# Patient Record
Sex: Female | Born: 1985 | Race: Black or African American | Hispanic: No | Marital: Single | Smoking: Never smoker
Health system: Southern US, Community
[De-identification: ages and names within clinical notes are randomized; demographics above are authoritative.]

## PROBLEM LIST (undated history)

## (undated) DIAGNOSIS — G43109 Migraine with aura, not intractable, without status migrainosus: Secondary | ICD-10-CM

## (undated) DIAGNOSIS — T8859XA Other complications of anesthesia, initial encounter: Secondary | ICD-10-CM

## (undated) DIAGNOSIS — T4145XA Adverse effect of unspecified anesthetic, initial encounter: Secondary | ICD-10-CM

## (undated) HISTORY — PX: EYE SURGERY: SHX253

## (undated) HISTORY — DX: Migraine with aura, not intractable, without status migrainosus: G43.109

## (undated) HISTORY — PX: HERNIA REPAIR: SHX51

---
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
## 2007-06-16 ENCOUNTER — Emergency Department (HOSPITAL_COMMUNITY): Admission: EM | Admit: 2007-06-16 | Discharge: 2007-06-16 | Payer: Self-pay | Admitting: Emergency Medicine

## 2008-07-05 ENCOUNTER — Emergency Department (HOSPITAL_COMMUNITY): Admission: EM | Admit: 2008-07-05 | Discharge: 2008-07-05 | Payer: Self-pay | Admitting: Emergency Medicine

## 2008-07-31 ENCOUNTER — Ambulatory Visit: Payer: Self-pay | Admitting: Family

## 2008-07-31 ENCOUNTER — Inpatient Hospital Stay (HOSPITAL_COMMUNITY): Admission: AD | Admit: 2008-07-31 | Discharge: 2008-08-01 | Payer: Self-pay | Admitting: Obstetrics and Gynecology

## 2008-09-03 ENCOUNTER — Inpatient Hospital Stay (HOSPITAL_COMMUNITY): Admission: AD | Admit: 2008-09-03 | Discharge: 2008-09-03 | Payer: Self-pay | Admitting: Obstetrics

## 2008-09-17 ENCOUNTER — Ambulatory Visit (HOSPITAL_COMMUNITY): Admission: RE | Admit: 2008-09-17 | Discharge: 2008-09-17 | Payer: Self-pay | Admitting: Obstetrics

## 2008-11-15 ENCOUNTER — Inpatient Hospital Stay (HOSPITAL_COMMUNITY): Admission: RE | Admit: 2008-11-15 | Discharge: 2008-11-17 | Payer: Self-pay | Admitting: Obstetrics

## 2010-03-15 IMAGING — US US OB LIMITED
1 series · 14 of 18 positions shown · non-contrast
Comparison: none

CLINICAL DATA: Pregnant patient with suprapubic and left-sided
pelvic pain.  G1 P1.  LMP 05/11/2008.

[Series 1: us ob limited · 0.26mm/px · 14 of 18 slices shown]
[im 1/18]
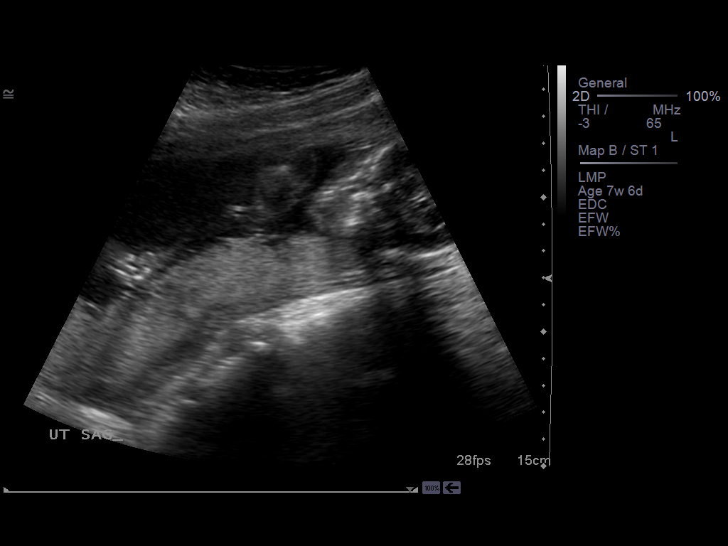
[im 2/18]
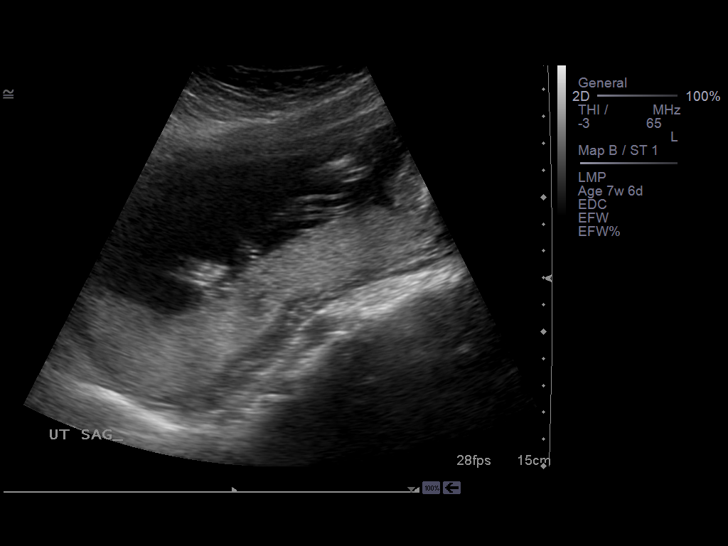
[im 4/18]
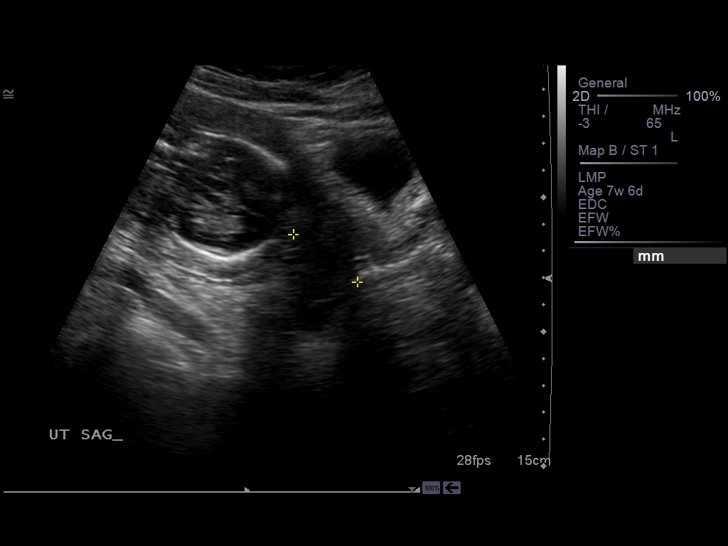
[im 5/18]
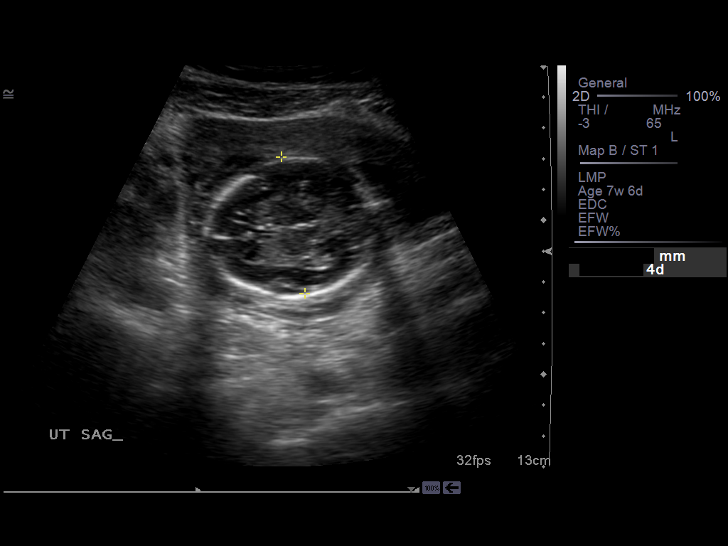
[im 6/18]
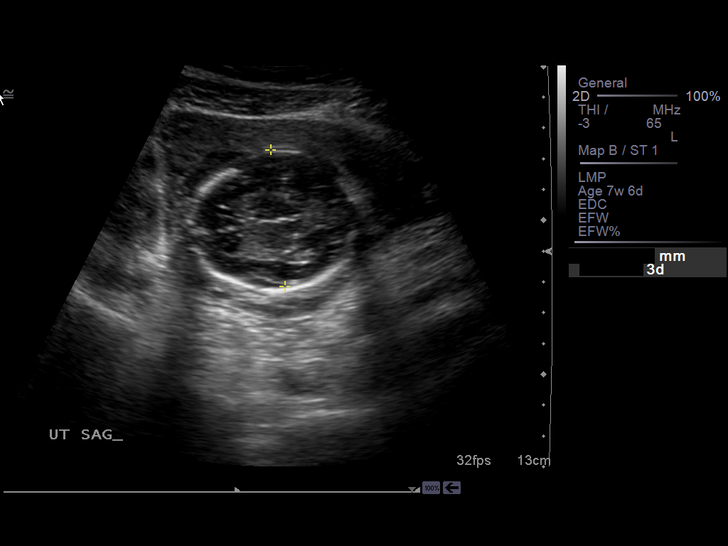
[im 8/18]
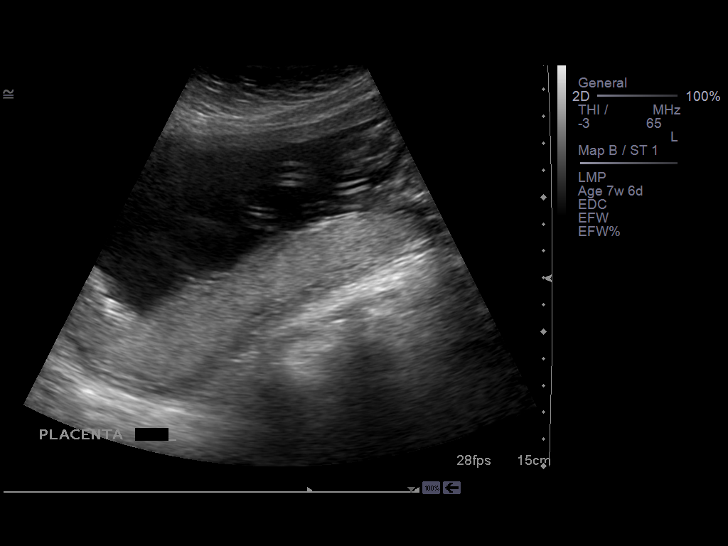
[im 9/18]
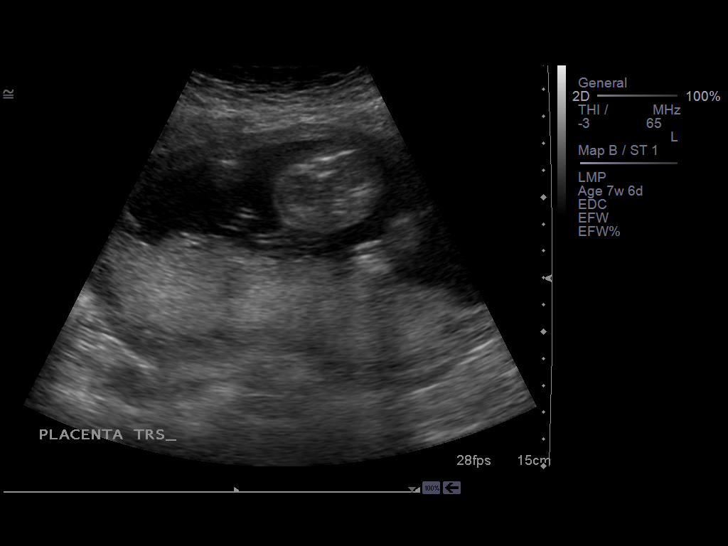
[im 10/18]
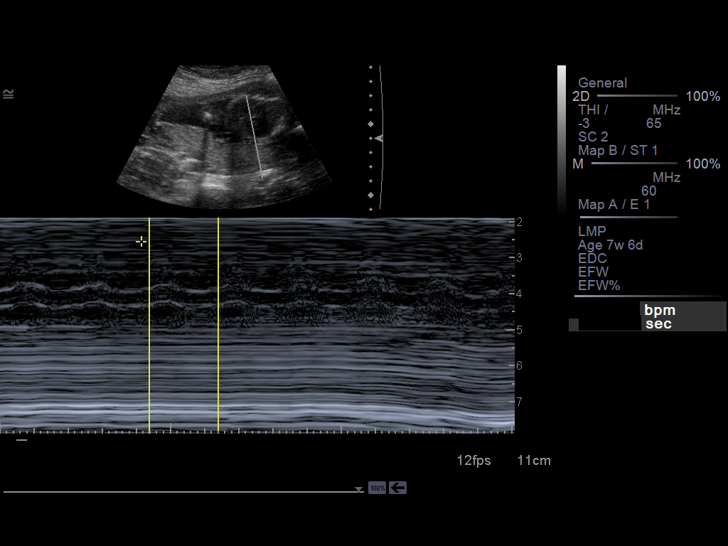
[im 11/18]
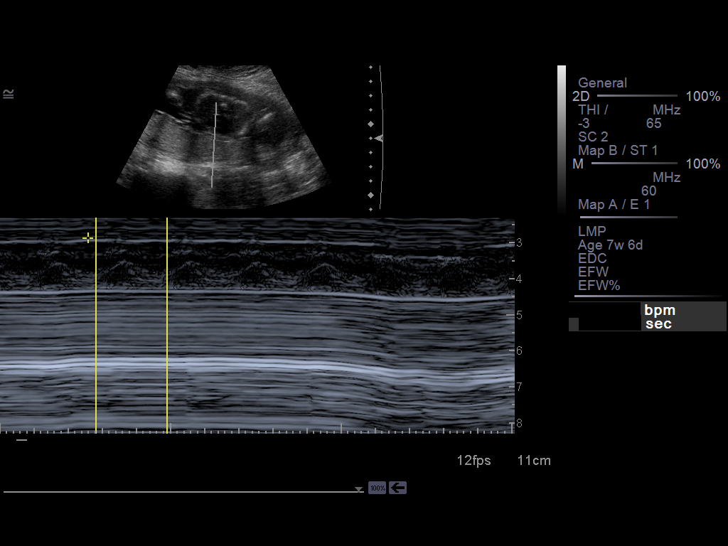
[im 13/18]
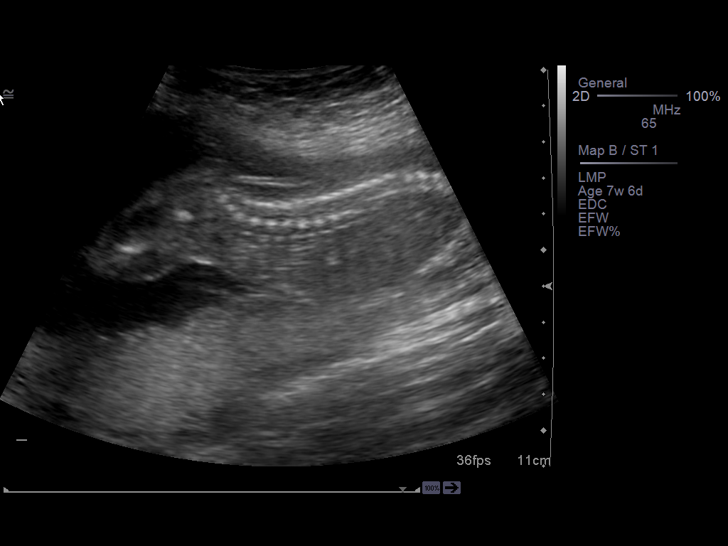
[im 14/18]
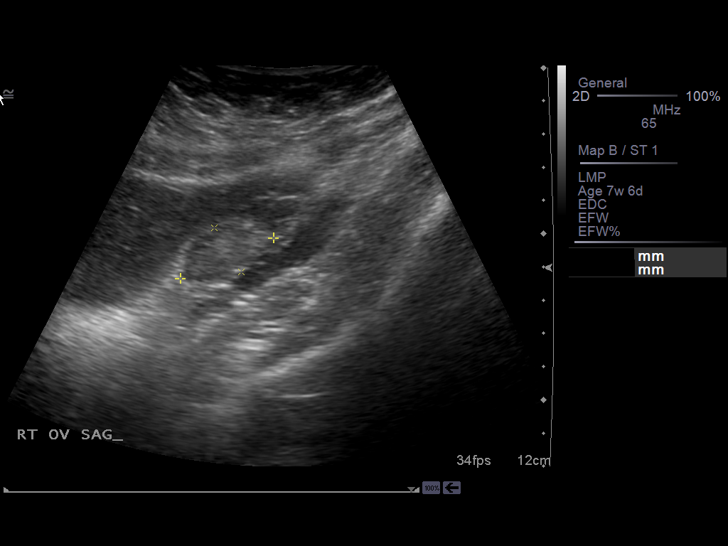
[im 15/18]
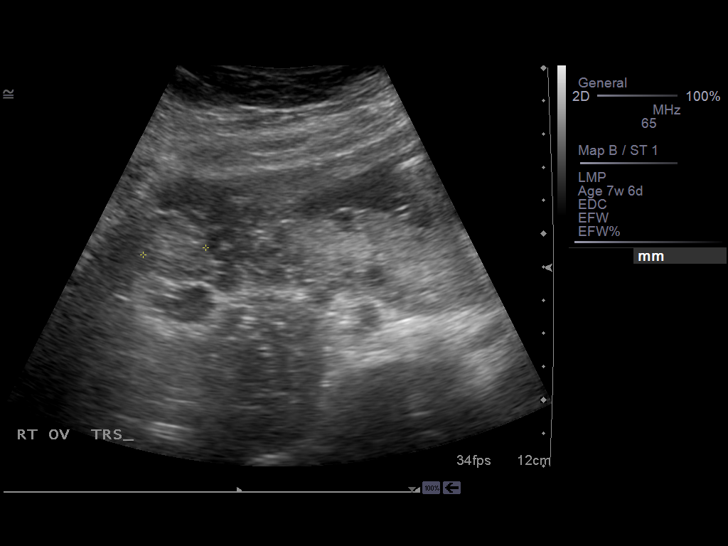
[im 17/18]
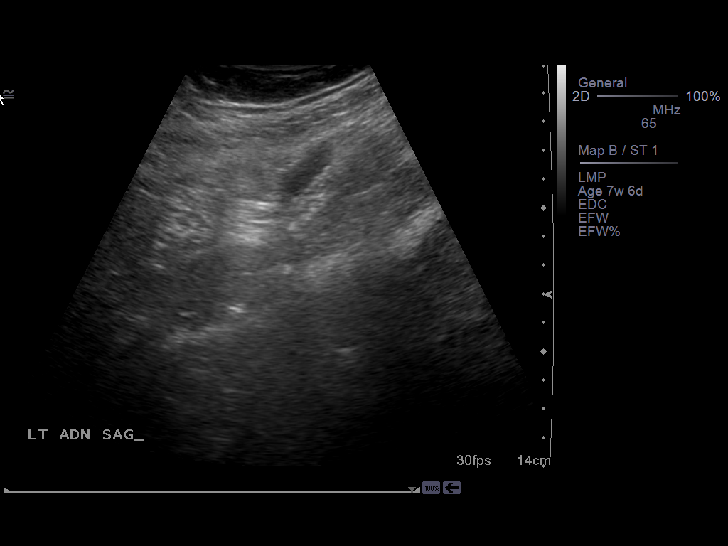
[im 18/18]
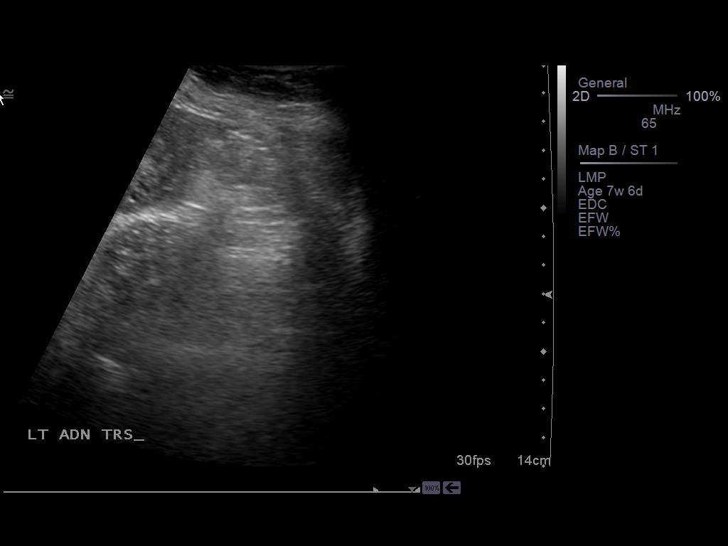

[14 of 18 positions shown; findings below may reference images not displayed]

LIMITED OBSTETRIC ULTRASOUND 07/05/2008:

Number of Fetuses: 1
Heart Rate: 511bpm
Movement: Visualized.
Breathing: Not visualized.
Presentation: Cephalic.
Placental Location: Posterior and fundal.
Grade: 0/1
Previa: No.
Amniotic Fluid (Subjective): Normal.

BPD:  4.5 cm (19 weeks 3 days).

MATERNAL FINDINGS:
Cervix closed.  Normal-appearing ovaries bilaterally.  No adnexal
masses or free fluid.
IMPRESSION: 1.  Single live intrauterine fetus with estimated gestational age
19 weeks 3 days by BPD.  This is well beyond the stated gestational
age by LMP.
2.  Normal-appearing ovaries.  No adnexal masses or free fluid.

## 2010-04-15 LAB — CBC
HCT: 33.8 % — ABNORMAL LOW (ref 36.0–46.0)
HCT: 37.8 % (ref 36.0–46.0)
Hemoglobin: 12.9 g/dL (ref 12.0–15.0)
MCHC: 34.1 g/dL (ref 30.0–36.0)
MCV: 92.7 fL (ref 78.0–100.0)
Platelets: 176 10*3/uL (ref 150–400)
RBC: 3.64 MIL/uL — ABNORMAL LOW (ref 3.87–5.11)
RDW: 13.2 % (ref 11.5–15.5)
RDW: 13.3 % (ref 11.5–15.5)
WBC: 12.1 10*3/uL — ABNORMAL HIGH (ref 4.0–10.5)

## 2010-04-15 LAB — RPR: RPR Ser Ql: NONREACTIVE

## 2010-04-20 LAB — URINE MICROSCOPIC-ADD ON

## 2010-04-20 LAB — WET PREP, GENITAL
Trich, Wet Prep: NONE SEEN
Yeast Wet Prep HPF POC: NONE SEEN

## 2010-04-20 LAB — URINALYSIS, ROUTINE W REFLEX MICROSCOPIC
Specific Gravity, Urine: 1.016 (ref 1.005–1.030)
Urobilinogen, UA: 1 mg/dL (ref 0.0–1.0)
pH: 6.5 (ref 5.0–8.0)

## 2010-05-29 NOTE — Op Note (Signed)
[place]  Patient:    Virginia Page, Virginia Page Visit Number: [id number] MRN: [id number]          Service Type: DSU Location: [facility] Attending Physician:  Shara Blazing Dictated by:   Pasty Spillers. Maple Hudson, M.D. Proc. Date: 07/22/00 Admit Date:  07/22/2000 Discharge Date: 07/22/2000                             Operative Report  PREOPERATIVE DIAGNOSIS:  Large-angle "V" pattern exotropia.  POSTOPERATIVE DIAGNOSIS:  Large-angle "V" pattern exotropia.  PROCEDURES: 1. Left lateral rectus muscle recession, 8.5 mm. 2. Left medial rectus muscle resection, 7.0 mm. 3. Right lateral rectus muscle recession, 9.0 mm. 4. Inferior oblique muscle recession OU.  SURGEON:  Pasty Spillers. Maple Hudson, M.D.  ANESTHESIA:  General (laryngeal mask).  COMPLICATIONS:  None.  DESCRIPTION OF PROCEDURE:  After routine preoperative evaluation, including informed consent, the patient was taken to the operating room, where she was identified by me.  General anesthesia was induced without difficulty after placement of appropriate monitors.  The patient was prepped and draped in the standard sterile fashion.  A lid speculum was placed in the left eye.  Through an inferotemporal fornix incision, conjunctiva and Tenons fascia, the left lateral rectus muscle was engaged on a series of muscle hooks.  A traction suture of 4-0 silk was passed under the left lateral rectus muscle, and this was used to draw the eye up and in.  Using two muscle hooks for exposure through the conjunctival incision, the left inferior oblique muscle was engaged on a series of muscle hooks and cleared of its fascial attachments all the way to its insertion, which was secured with a fine curved hemostat. The muscle was disinserted and its cut end brought forward and secured with a double-armed 6-0 Vicryl suture, with a double locking bite at each border of the muscle.  The right inferior rectus muscle was engaged on a  series of muscle hooks.  A mark was made on the sclera 3 mm posterior and 3 mm temporal to the temporal border of the anterior rectus insertion.  This was used as the exit point for the two pole sutures of the inferior oblique muscle, which were passed in crossed-swords fashion and tied securely.  The left lateral rectus muscle was then again engaged on a series of muscle hooks and carefully cleared of its surrounding fascial attachments.  The tendon was secured with a double-armed 6-0 Vicryl suture with a double locking bite at each border of the tendon.  The muscle was disinserted from the globe using Westcott scissors.  It was reattached to sclera at a measured distance of 8.5 mm posterior to the unoperated insertion, using direct scleral passes in a crossed-swords fashion.  The suture ends were tied securely after the position of the muscle had been checked and found to be accurate.  The conjunctiva was closed with two interrupted 6-0 Vicryl sutures.  Through an inferonasal fornix incision through conjunctiva and Tenons fascia, the left medial rectus muscle was engaged on a series of muscle hooks and carefully cleared of its surrounding fascial attachments to at least 15 mm posterior to its insertion. The muscle was spread between two self-retaining hooks.  A 2 mm bite was taken of the center of the muscle belly at a measured distance of 7.0 mm posterior to the insertion.  Each pole suture was passed from the center of the muscle belly  to the periphery, 7.0 mm posterior to and parallel to the insertion, and a double locking bite was placed at each border of the muscle.  A resection clamp was placed on the muscle just anterior to these sutures.  The muscle was disinserted.  Each pole suture was then passed posteriorly to anteriorly through the periphery of the muscle stump, then anteriorly to posteriorly near the center of the stump, and then posteriorly to anteriorly through the  center of the muscle belly, just posterior to the previously-placed knots.  The muscle was drawn up to the level of the original insertion, and the suture ends were tied securely after all slack had been carefully removed.  The resection clamp was removed and the portion of the muscle anterior to these sutures was carefully excised.  Conjunctiva was closed with two interrupted 6-0 Vicryl sutures.  The lid speculum was transferred to the right eye, where the right inferior oblique was recessed just as described for the left, and the right lateral rectus muscle was recessed as described for the left, except the right was recessed 9.0 mm instead of 8.5.  No other muscle was operated on the right eye.  Conjunctiva was closed with two interrupted 6-0 Vicryl sutures. Tobradex ointment was placed in each eye.  The patient was awakened without difficulty and taken to the recovery room in stable condition, having suffered no intraoperative or immediate postoperative complications. Dictated by:   Pasty Spillers. Maple Hudson, M.D. Attending Physician:  Shara Blazing DD:  10/06/00 TD:  10/06/00 Job: [id number]

## 2010-06-29 ENCOUNTER — Emergency Department: Payer: Self-pay | Admitting: Emergency Medicine

## 2010-10-08 LAB — POCT PREGNANCY, URINE
Operator id: 277751
Preg Test, Ur: NEGATIVE

## 2011-06-24 ENCOUNTER — Ambulatory Visit: Payer: Self-pay | Admitting: Family Medicine

## 2011-07-27 ENCOUNTER — Ambulatory Visit: Payer: Self-pay | Admitting: Emergency Medicine

## 2011-08-30 ENCOUNTER — Encounter: Payer: Self-pay | Admitting: Emergency Medicine

## 2011-08-30 ENCOUNTER — Ambulatory Visit (INDEPENDENT_AMBULATORY_CARE_PROVIDER_SITE_OTHER): Payer: Medicaid Other | Admitting: Emergency Medicine

## 2011-08-30 VITALS — BP 116/79 | HR 76 | Ht 62.75 in | Wt 190.0 lb

## 2011-08-30 DIAGNOSIS — Z0000 Encounter for general adult medical examination without abnormal findings: Secondary | ICD-10-CM

## 2011-08-30 DIAGNOSIS — G43109 Migraine with aura, not intractable, without status migrainosus: Secondary | ICD-10-CM | POA: Insufficient documentation

## 2011-08-30 DIAGNOSIS — Z309 Encounter for contraceptive management, unspecified: Secondary | ICD-10-CM | POA: Insufficient documentation

## 2011-08-30 DIAGNOSIS — E669 Obesity, unspecified: Secondary | ICD-10-CM

## 2011-08-30 MED ORDER — LEVONORGEST-ETH ESTRAD 91-DAY 0.15-0.03 &0.01 MG PO TABS: 1.0000 | ORAL_TABLET | Freq: Every day | ORAL | Status: DC

## 2011-08-30 MED ORDER — PROPRANOLOL HCL ER 60 MG PO CP24: 60.0000 mg | ORAL_CAPSULE | Freq: Every day | ORAL | Status: DC

## 2011-08-30 NOTE — Assessment & Plan Note (Signed)
No red flags or neurologic findings.  Will start prophylaxis with propranolol ER 60mg  daily.  Side effects discussed.  If not tolerated will try topiramate.

## 2011-08-30 NOTE — Assessment & Plan Note (Signed)
Will need to check screening labs at next visit.

## 2011-08-30 NOTE — Patient Instructions (Addendum)
It was nice to see you!  I have sent a refill of your Seasonique to the pharmacy.  I also sent a prescription for Propranolol to the pharmacy.  Take 1 capsule once a day to prevent migraines.  The most common side effects are feeling run down, low heart rate and dizzy with standing.  If you have any of these please let me know and we can try another medicine.  Follow up in 1 year for annual exam or sooner as needed.

## 2011-08-30 NOTE — Assessment & Plan Note (Signed)
Working on diet and exercise

## 2011-08-30 NOTE — Assessment & Plan Note (Signed)
Doing well with seasonique.  Pap up to date.  Will refill seasonique.

## 2011-08-30 NOTE — Progress Notes (Signed)
  Subjective:    Patient ID: Virginia Page, female    DOB: 1985-09-27, 26 y.o.   MRN: [id number]  HPI Virginia Page is here for a new patient appointment.  I have reviewed and updated the following as appropriate: allergies, current medications, past family history, past medical history, past social history, past surgical history and problem list.  Eats a varied diet with fruits and vegetables.  Exercises 3-4 times a week.  No acute concerns.  Has been on Seasonique for birth control the last 2-3 years, no problems, needs a refill.  Migraines: Long history.  Typically one sided and throbbing.  +photophobia, nausea, aura.  Uses OTC meds, but they often do not work.  Occur 1-2 times per week.  OCPs have not affected migraines.    Review of Systems See HPI, negative for fevers, chest pain, shortness of breath, abdominal pain, constipation, numbness/tingling, dizziness.    Objective:   Physical Exam BP 116/79  Pulse 76  Ht 5' 2.75" (1.594 m)  Wt 190 lb (86.183 kg)  BMI 33.93 kg/m2 Gen: alert, cooperative, NAD HEENT: AT/[place], PERRL, EOMI, sclera white, MMM, no pharyngeal erythema or exudate Neck: no LAD, supple, thyroid normal CV: RRR, no murmurs Pulm: CTAB Abd: +BS, soft, NTND Ext: no edema, 2+ DP pulses Neuro: PERRL, 5/5 strength in all extremities, gait normal     Assessment & Plan:

## 2012-05-18 LAB — OB RESULTS CONSOLE RPR: RPR: NONREACTIVE

## 2012-05-18 LAB — OB RESULTS CONSOLE RUBELLA ANTIBODY, IGM: Rubella: IMMUNE

## 2012-08-10 ENCOUNTER — Other Ambulatory Visit: Payer: Self-pay | Admitting: Obstetrics and Gynecology

## 2012-08-14 LAB — OB RESULTS CONSOLE GC/CHLAMYDIA: Chlamydia: NEGATIVE

## 2012-08-17 ENCOUNTER — Institutional Professional Consult (permissible substitution): Payer: Self-pay | Admitting: Pediatrics

## 2012-08-29 ENCOUNTER — Telehealth: Payer: Self-pay | Admitting: *Deleted

## 2012-08-29 NOTE — Telephone Encounter (Signed)
npi given [facility] for c section Wyatt Haste, RN-BSN

## 2012-09-01 ENCOUNTER — Encounter (HOSPITAL_COMMUNITY)
Admission: RE | Admit: 2012-09-01 | Discharge: 2012-09-01 | Disposition: A | Discharge status: Home / Self Care | Payer: Medicaid Other | Source: Ambulatory Visit | Attending: Obstetrics and Gynecology | Admitting: Obstetrics and Gynecology

## 2012-09-01 ENCOUNTER — Encounter (HOSPITAL_COMMUNITY): Payer: Self-pay

## 2012-09-01 HISTORY — DX: Adverse effect of unspecified anesthetic, initial encounter: T41.45XA

## 2012-09-01 HISTORY — DX: Other complications of anesthesia, initial encounter: T88.59XA

## 2012-09-01 LAB — CBC
Hemoglobin: 13.9 g/dL (ref 12.0–15.0)
MCHC: 35.2 g/dL (ref 30.0–36.0)
WBC: 9.1 10*3/uL (ref 4.0–10.5)

## 2012-09-01 LAB — ABO/RH: ABO/RH(D): B POS

## 2012-09-01 LAB — RPR: RPR Ser Ql: NONREACTIVE

## 2012-09-01 LAB — TYPE AND SCREEN: ABO/RH(D): B POS

## 2012-09-01 NOTE — Pre-Procedure Instructions (Signed)
No prenatal labs available at pre op visit.

## 2012-09-01 NOTE — Patient Instructions (Addendum)
20 Virginia Page  09/01/2012   Your procedure is scheduled on:  09/04/12  Enter through the Main Entrance of [facility] at 7:30  Pick up the phone at the desk and dial [phone].   Call this number if you have problems the morning of surgery: [phone]   Remember:   Do not eat food:After Midnight.  Do not drink clear liquids: After Midnight.  Take these medicines the morning of surgery with A SIP OF WATER: NA   Do not wear jewelry, make-up or nail polish.  Do not wear lotions, powders, or perfumes. You may wear deodorant.  Do not shave 48 hours prior to surgery.  Do not bring valuables to the hospital.  [facility] is not responsible                  for any belongings or valuables brought to the hospital.  Contacts, dentures or bridgework may not be worn into surgery.  Leave suitcase in the car. After surgery it may be brought to your room.  For patients admitted to the hospital, checkout time is 11:00 AM the day of                discharge.   Patients discharged the day of surgery will not be allowed to drive                   home.  Name and phone number of your driver: NA  Special Instructions: Shower using CHG 2 nights before surgery and the night before surgery.  If you shower the day of surgery use CHG.  Use special wash - you have one bottle of CHG for all showers.  You should use approximately 1/3 of the bottle for each shower.   Please read over the following fact sheets that you were given: Surgical Site Infection Prevention

## 2012-09-03 NOTE — H&P (Signed)
  Admission History and Physical Exam for an Obstetrics Patient  [name] is a 27 y.o. female, G3 P2-0-0-2, at [redacted]w[redacted]d gestation, who presents for a repeat LTCS and BTL. She has been followed at the [facility] and Gynecology division of [facility].  Her pregnancy has been complicated by two prior CS. Late entry into prenatal care. See history below.    Past Medical History  Diagnosis Date  . Migraine with aura   . Complication of anesthesia     failed spinal anes last delivery    No prescriptions prior to admission    Past Surgical History  Procedure Laterality Date  . Cesarean section    . Eye surgery    . Hernia repair      Allergies  Allergen Reactions  . Demerol [Meperidine]     Family History: family history includes Diabetes in her father, maternal aunt, and maternal uncle; Heart disease in her father; Hyperlipidemia in her father; Hypertension in her father; Kidney disease in her father.  Social History:  reports that she has never smoked. She has never used smokeless tobacco. She reports that she does not drink alcohol or use illicit drugs.  Review of systems: Normal pregnancy complaints.  Admission Physical Exam:    There is no weight on file to calculate BMI.  There were no vitals taken for this visit.  HEENT:                 Within normal limits Chest:                   Clear Heart:                    Regular rate and rhythm Abdomen:             Gravid and nontender Extremities:          Grossly normal Neurologic exam: Grossly normal Pelvic exam:         Cervix: Closed  Prenatal labs: ABO, Rh:             --/--/B POS (08/22 1410) HBsAg:                   Neg HIV:                         NR GBS:                      Negative Antibody:              NEG (08/22 1407) Rubella:                 Immune RPR:                    NON REACTIVE (08/22 1406)      Prenatal Transfer Tool  Maternal Diabetes: No Genetic  Screening: Too late upon entry into prenatal care Maternal Ultrasounds/Referrals: No Fetal Ultrasounds or other Referrals:  No Maternal Substance Abuse:  None Significant Maternal Medications:  None Significant Maternal Lab Results:  None Other Comments:    Assessment:  [redacted]w[redacted]d gestation  Prior CS's  Desires Repeat CS and BTL  Plan:  Repeat LTCS and BTL   [name] 09/03/2012, 10:43 PM

## 2012-09-04 ENCOUNTER — Encounter (HOSPITAL_COMMUNITY): Payer: Self-pay | Admitting: Anesthesiology

## 2012-09-04 ENCOUNTER — Encounter (HOSPITAL_COMMUNITY)
Admission: AD | Disposition: A | Discharge status: Home / Self Care | Payer: Self-pay | Source: Ambulatory Visit | Attending: Obstetrics and Gynecology

## 2012-09-04 ENCOUNTER — Inpatient Hospital Stay (HOSPITAL_COMMUNITY): Payer: Medicaid Other | Admitting: Anesthesiology

## 2012-09-04 ENCOUNTER — Inpatient Hospital Stay (HOSPITAL_COMMUNITY)
Admission: AD | Admit: 2012-09-04 | Discharge: 2012-09-06 | DRG: 766 | Disposition: A | Discharge status: Home / Self Care | Payer: Medicaid Other | Source: Ambulatory Visit | Attending: Obstetrics and Gynecology | Admitting: Obstetrics and Gynecology

## 2012-09-04 ENCOUNTER — Encounter (HOSPITAL_COMMUNITY): Payer: Self-pay | Admitting: *Deleted

## 2012-09-04 DIAGNOSIS — O34219 Maternal care for unspecified type scar from previous cesarean delivery: Principal | ICD-10-CM | POA: Diagnosis present

## 2012-09-04 DIAGNOSIS — Z302 Encounter for sterilization: Secondary | ICD-10-CM

## 2012-09-04 SURGERY — Surgical Case
Anesthesia: Spinal | Site: Abdomen | Wound class: Clean Contaminated

## 2012-09-04 MED ORDER — CEFAZOLIN SODIUM-DEXTROSE 2-3 GM-% IV SOLR
2.0000 g | INTRAVENOUS | Status: AC
  Administered 2012-09-04: 2 g via INTRAVENOUS

## 2012-09-04 MED ORDER — ONDANSETRON HCL 4 MG/2ML IJ SOLN: 4.0000 mg | Freq: Three times a day (TID) | INTRAMUSCULAR | Status: DC | PRN

## 2012-09-04 MED ORDER — LACTATED RINGERS IV SOLN
INTRAVENOUS | Status: DC
  Administered 2012-09-04 (×3): via INTRAVENOUS

## 2012-09-04 MED ORDER — SCOPOLAMINE 1 MG/3DAYS TD PT72
MEDICATED_PATCH | TRANSDERMAL | Status: AC
  Administered 2012-09-04: 1.5 mg via TRANSDERMAL
  Filled 2012-09-04: qty 1

## 2012-09-04 MED ORDER — OXYTOCIN 10 UNIT/ML IJ SOLN
INTRAMUSCULAR | Status: AC
  Filled 2012-09-04: qty 4

## 2012-09-04 MED ORDER — NALBUPHINE SYRINGE 5 MG/0.5 ML
INJECTION | INTRAMUSCULAR | Status: AC
  Administered 2012-09-04: 10 mg via SUBCUTANEOUS
  Filled 2012-09-04: qty 1

## 2012-09-04 MED ORDER — ZOLPIDEM TARTRATE 5 MG PO TABS: 5.0000 mg | ORAL_TABLET | Freq: Every evening | ORAL | Status: DC | PRN

## 2012-09-04 MED ORDER — PRENATAL MULTIVITAMIN CH: 1.0000 | ORAL_TABLET | Freq: Every day | ORAL | Status: DC

## 2012-09-04 MED ORDER — OXYTOCIN 10 UNIT/ML IJ SOLN
40.0000 [IU] | INTRAVENOUS | Status: DC | PRN
  Administered 2012-09-04: 40 [IU] via INTRAVENOUS

## 2012-09-04 MED ORDER — ONDANSETRON HCL 4 MG/2ML IJ SOLN
INTRAMUSCULAR | Status: AC
  Filled 2012-09-04: qty 2

## 2012-09-04 MED ORDER — LANOLIN HYDROUS EX OINT: 1.0000 "application " | TOPICAL_OINTMENT | CUTANEOUS | Status: DC | PRN

## 2012-09-04 MED ORDER — LACTATED RINGERS IV SOLN
INTRAVENOUS | Status: DC
  Administered 2012-09-04 – 2012-09-05 (×2): via INTRAVENOUS

## 2012-09-04 MED ORDER — DIPHENHYDRAMINE HCL 25 MG PO CAPS: 25.0000 mg | ORAL_CAPSULE | Freq: Four times a day (QID) | ORAL | Status: DC | PRN

## 2012-09-04 MED ORDER — MEDROXYPROGESTERONE ACETATE 150 MG/ML IM SUSP: 150.0000 mg | INTRAMUSCULAR | Status: DC | PRN

## 2012-09-04 MED ORDER — SIMETHICONE 80 MG PO CHEW: 80.0000 mg | CHEWABLE_TABLET | ORAL | Status: DC | PRN

## 2012-09-04 MED ORDER — NALBUPHINE HCL 10 MG/ML IJ SOLN
5.0000 mg | INTRAMUSCULAR | Status: DC | PRN
  Filled 2012-09-04: qty 1

## 2012-09-04 MED ORDER — BUPIVACAINE-EPINEPHRINE 0.5% -1:200000 IJ SOLN
INTRAMUSCULAR | Status: DC | PRN
  Administered 2012-09-04: 10 mL

## 2012-09-04 MED ORDER — MORPHINE SULFATE (PF) 0.5 MG/ML IJ SOLN
INTRAMUSCULAR | Status: DC | PRN
  Administered 2012-09-04: .2 mg via INTRATHECAL

## 2012-09-04 MED ORDER — DIPHENHYDRAMINE HCL 25 MG PO CAPS: 25.0000 mg | ORAL_CAPSULE | ORAL | Status: DC | PRN

## 2012-09-04 MED ORDER — KETOROLAC TROMETHAMINE 30 MG/ML IJ SOLN: 30.0000 mg | Freq: Four times a day (QID) | INTRAMUSCULAR | Status: AC | PRN

## 2012-09-04 MED ORDER — SIMETHICONE 80 MG PO CHEW
80.0000 mg | CHEWABLE_TABLET | Freq: Three times a day (TID) | ORAL | Status: DC
  Administered 2012-09-04 – 2012-09-06 (×6): 80 mg via ORAL

## 2012-09-04 MED ORDER — WITCH HAZEL-GLYCERIN EX PADS: 1.0000 "application " | MEDICATED_PAD | CUTANEOUS | Status: DC | PRN

## 2012-09-04 MED ORDER — 0.9 % SODIUM CHLORIDE (POUR BTL) OPTIME
TOPICAL | Status: DC | PRN
  Administered 2012-09-04: 1000 mL

## 2012-09-04 MED ORDER — ONDANSETRON HCL 4 MG/2ML IJ SOLN: 4.0000 mg | INTRAMUSCULAR | Status: DC | PRN

## 2012-09-04 MED ORDER — CEFAZOLIN SODIUM-DEXTROSE 2-3 GM-% IV SOLR
INTRAVENOUS | Status: AC
  Filled 2012-09-04: qty 50

## 2012-09-04 MED ORDER — NALOXONE HCL 0.4 MG/ML IJ SOLN: 0.4000 mg | INTRAMUSCULAR | Status: DC | PRN

## 2012-09-04 MED ORDER — DIPHENHYDRAMINE HCL 50 MG/ML IJ SOLN: 12.5000 mg | INTRAMUSCULAR | Status: DC | PRN

## 2012-09-04 MED ORDER — KETOROLAC TROMETHAMINE 30 MG/ML IJ SOLN
INTRAMUSCULAR | Status: AC
  Administered 2012-09-04: 30 mg via INTRAMUSCULAR
  Filled 2012-09-04: qty 1

## 2012-09-04 MED ORDER — OXYTOCIN 40 UNITS IN LACTATED RINGERS INFUSION - SIMPLE MED: 62.5000 mL/h | INTRAVENOUS | Status: AC

## 2012-09-04 MED ORDER — MORPHINE SULFATE 0.5 MG/ML IJ SOLN
INTRAMUSCULAR | Status: AC
  Filled 2012-09-04: qty 10

## 2012-09-04 MED ORDER — MENTHOL 3 MG MT LOZG: 1.0000 | LOZENGE | OROMUCOSAL | Status: DC | PRN

## 2012-09-04 MED ORDER — FENTANYL CITRATE 0.05 MG/ML IJ SOLN
INTRAMUSCULAR | Status: AC
  Filled 2012-09-04: qty 2

## 2012-09-04 MED ORDER — DIPHENHYDRAMINE HCL 50 MG/ML IJ SOLN: 25.0000 mg | INTRAMUSCULAR | Status: DC | PRN

## 2012-09-04 MED ORDER — TETANUS-DIPHTH-ACELL PERTUSSIS 5-2.5-18.5 LF-MCG/0.5 IM SUSP: 0.5000 mL | Freq: Once | INTRAMUSCULAR | Status: DC

## 2012-09-04 MED ORDER — BUPIVACAINE-EPINEPHRINE (PF) 0.5% -1:200000 IJ SOLN
INTRAMUSCULAR | Status: AC
  Filled 2012-09-04: qty 10

## 2012-09-04 MED ORDER — SCOPOLAMINE 1 MG/3DAYS TD PT72: 1.0000 | MEDICATED_PATCH | Freq: Once | TRANSDERMAL | Status: DC

## 2012-09-04 MED ORDER — PHENYLEPHRINE HCL 10 MG/ML IJ SOLN
INTRAMUSCULAR | Status: DC | PRN
  Administered 2012-09-04 (×2): 40 ug via INTRAVENOUS
  Administered 2012-09-04 (×6): 80 ug via INTRAVENOUS
  Administered 2012-09-04: 40 ug via INTRAVENOUS

## 2012-09-04 MED ORDER — ONDANSETRON HCL 4 MG PO TABS: 4.0000 mg | ORAL_TABLET | ORAL | Status: DC | PRN

## 2012-09-04 MED ORDER — NALOXONE HCL 1 MG/ML IJ SOLN
1.0000 ug/kg/h | INTRAVENOUS | Status: DC | PRN
  Filled 2012-09-04: qty 2

## 2012-09-04 MED ORDER — PHENYLEPHRINE 40 MCG/ML (10ML) SYRINGE FOR IV PUSH (FOR BLOOD PRESSURE SUPPORT)
PREFILLED_SYRINGE | INTRAVENOUS | Status: AC
  Filled 2012-09-04: qty 15

## 2012-09-04 MED ORDER — FENTANYL CITRATE 0.05 MG/ML IJ SOLN: 25.0000 ug | INTRAMUSCULAR | Status: DC | PRN

## 2012-09-04 MED ORDER — METOCLOPRAMIDE HCL 5 MG/ML IJ SOLN
10.0000 mg | Freq: Three times a day (TID) | INTRAMUSCULAR | Status: DC | PRN
  Administered 2012-09-04: 10 mg via INTRAVENOUS
  Filled 2012-09-04: qty 2

## 2012-09-04 MED ORDER — OXYCODONE-ACETAMINOPHEN 5-325 MG PO TABS: 1.0000 | ORAL_TABLET | ORAL | Status: DC | PRN

## 2012-09-04 MED ORDER — IBUPROFEN 600 MG PO TABS
600.0000 mg | ORAL_TABLET | Freq: Four times a day (QID) | ORAL | Status: DC
  Administered 2012-09-04: 600 mg via ORAL
  Filled 2012-09-04: qty 1

## 2012-09-04 MED ORDER — SODIUM CHLORIDE 0.9 % IJ SOLN: 3.0000 mL | INTRAMUSCULAR | Status: DC | PRN

## 2012-09-04 MED ORDER — FENTANYL CITRATE 0.05 MG/ML IJ SOLN
INTRAMUSCULAR | Status: DC | PRN
  Administered 2012-09-04: 12.5 ug via INTRATHECAL

## 2012-09-04 MED ORDER — DIBUCAINE 1 % RE OINT: 1.0000 "application " | TOPICAL_OINTMENT | RECTAL | Status: DC | PRN

## 2012-09-04 MED ORDER — BUPIVACAINE IN DEXTROSE 0.75-8.25 % IT SOLN
INTRATHECAL | Status: DC | PRN
  Administered 2012-09-04: 10.5 mg via INTRATHECAL

## 2012-09-04 MED ORDER — MEASLES, MUMPS & RUBELLA VAC ~~LOC~~ INJ
0.5000 mL | INJECTION | Freq: Once | SUBCUTANEOUS | Status: DC
  Filled 2012-09-04: qty 0.5

## 2012-09-04 MED ORDER — ADULT MULTIVITAMIN W/MINERALS CH
1.0000 | ORAL_TABLET | Freq: Every day | ORAL | Status: DC
  Filled 2012-09-04 (×2): qty 1

## 2012-09-04 MED ORDER — ONDANSETRON HCL 4 MG/2ML IJ SOLN
INTRAMUSCULAR | Status: DC | PRN
  Administered 2012-09-04: 4 mg via INTRAVENOUS

## 2012-09-04 MED ORDER — SENNOSIDES-DOCUSATE SODIUM 8.6-50 MG PO TABS
2.0000 | ORAL_TABLET | Freq: Every day | ORAL | Status: DC
  Administered 2012-09-04 – 2012-09-05 (×2): 2 via ORAL

## 2012-09-04 MED ORDER — LACTATED RINGERS IV SOLN
INTRAVENOUS | Status: DC | PRN
  Administered 2012-09-04: 14:00:00 via INTRAVENOUS

## 2012-09-04 SURGICAL SUPPLY — 39 items
CLAMP CORD UMBIL (MISCELLANEOUS) ×2 IMPLANT
CLOTH BEACON ORANGE TIMEOUT ST (SAFETY) ×2 IMPLANT
CONTAINER PREFILL 10% NBF 15ML (MISCELLANEOUS) ×4 IMPLANT
DRAIN JACKSON PRT FLT 7MM (DRAIN) IMPLANT
DRAPE LG THREE QUARTER DISP (DRAPES) ×2 IMPLANT
DRSG OPSITE POSTOP 4X10 (GAUZE/BANDAGES/DRESSINGS) ×2 IMPLANT
DURAPREP 26ML APPLICATOR (WOUND CARE) ×6 IMPLANT
ELECT REM PT RETURN 9FT ADLT (ELECTROSURGICAL) ×2
ELECTRODE REM PT RTRN 9FT ADLT (ELECTROSURGICAL) ×1 IMPLANT
EVACUATOR SILICONE 100CC (DRAIN) IMPLANT
EXTRACTOR VACUUM M CUP 4 TUBE (SUCTIONS) ×2 IMPLANT
GLOVE BIOGEL PI IND STRL 7.0 (GLOVE) ×3 IMPLANT
GLOVE BIOGEL PI IND STRL 8.5 (GLOVE) ×1 IMPLANT
GLOVE BIOGEL PI INDICATOR 7.0 (GLOVE) ×3
GLOVE BIOGEL PI INDICATOR 8.5 (GLOVE) ×1
GLOVE ECLIPSE 8.0 STRL XLNG CF (GLOVE) ×4 IMPLANT
GOWN PREVENTION PLUS XXLARGE (GOWN DISPOSABLE) ×2 IMPLANT
GOWN STRL REIN XL XLG (GOWN DISPOSABLE) ×6 IMPLANT
KIT ABG SYR 3ML LUER SLIP (SYRINGE) IMPLANT
NEEDLE HYPO 25X1 1.5 SAFETY (NEEDLE) ×2 IMPLANT
NEEDLE HYPO 25X5/8 SAFETYGLIDE (NEEDLE) IMPLANT
PACK C SECTION WH (CUSTOM PROCEDURE TRAY) ×2 IMPLANT
PAD OB MATERNITY 4.3X12.25 (PERSONAL CARE ITEMS) ×2 IMPLANT
RINGERS IRRIG 1000ML POUR BTL (IV SOLUTION) IMPLANT
STAPLER VISISTAT 35W (STAPLE) IMPLANT
SUT MNCRL AB 3-0 PS2 27 (SUTURE) ×2 IMPLANT
SUT PLAIN 0 NONE (SUTURE) ×2 IMPLANT
SUT SILK 3 0 FS 1X18 (SUTURE) IMPLANT
SUT VIC AB 0 CT1 27 (SUTURE) ×3
SUT VIC AB 0 CT1 27XBRD ANBCTR (SUTURE) ×3 IMPLANT
SUT VIC AB 2-0 CTX 36 (SUTURE) ×4 IMPLANT
SUT VIC AB 3-0 CT1 27 (SUTURE) ×1
SUT VIC AB 3-0 CT1 TAPERPNT 27 (SUTURE) ×1 IMPLANT
SUT VIC AB 3-0 SH 27 (SUTURE) ×1
SUT VIC AB 3-0 SH 27X BRD (SUTURE) ×1 IMPLANT
SYR CONTROL 10ML LL (SYRINGE) ×2 IMPLANT
TOWEL OR 17X24 6PK STRL BLUE (TOWEL DISPOSABLE) ×2 IMPLANT
TRAY FOLEY CATH 14FR (SET/KITS/TRAYS/PACK) ×2 IMPLANT
WATER STERILE IRR 1000ML POUR (IV SOLUTION) IMPLANT

## 2012-09-04 NOTE — Anesthesia Postprocedure Evaluation (Signed)
Anesthesia Post Note  Patient: Virginia Page  Procedure(s) Performed: Procedure(s) (LRB): REPEAT CESAREAN SECTION REPEAT/BILATERAL TUBAL LIGATION (N/A)  Anesthesia type: Spinal  Patient location: PACU  Post pain: Pain level controlled  Post assessment: Post-op Vital signs reviewed  Last Vitals:  Filed Vitals:   09/04/12 1445  BP: 109/56  Pulse: 67  Temp:   Resp: 17    Post vital signs: Reviewed  Level of consciousness: awake  Complications: No apparent anesthesia complications

## 2012-09-04 NOTE — Transfer of Care (Signed)
Immediate Anesthesia Transfer of Care Note  Patient: Virginia Page  Procedure(s) Performed: Procedure(s) with comments: REPEAT CESAREAN SECTION REPEAT/BILATERAL TUBAL LIGATION (N/A) - Repeat C/S  Patient Location: PACU  Anesthesia Type:Spinal  Level of Consciousness: awake  Airway & Oxygen Therapy: Patient Spontanous Breathing  Post-op Assessment: Report given to PACU RN and Post -op Vital signs reviewed and stable  Post vital signs: stable  Complications: No apparent anesthesia complications

## 2012-09-04 NOTE — H&P (Signed)
The patient was interviewed and examined today.  The previously documented history and physical examination was reviewed. There are no changes. The operative procedure was reviewed. The risks and benefits were outlined again. The specific risks include, but are not limited to, anesthetic complications, bleeding, infections, and possible damage to the surrounding organs. The patient's questions were answered.  We are ready to proceed as outlined. The likelihood of the patient achieving the goals of this procedure is very likely.   Breiona Couvillon Vernon Lodie Waheed, M.D.  

## 2012-09-04 NOTE — Op Note (Signed)
OPERATIVE NOTE  Patient's Name: Virginia Page  Date of Birth: November 08, 1985   Medical Records Number: [id number]   Date of Operation: 09/04/2012   Preoperative diagnosis:  [redacted]w[redacted]d weeks gestation  Prior Cesarean Section, desires sterilization  Desires Repeat Cesarean Section  Obesity  Postoperative diagnosis:  [redacted]w[redacted]d weeks gestation  Prior Cesarean Section, desires sterilization  Desires Repeat Cesarean Section  Obesity  Pelvic Adhesions  Procedure:  Repeat low transverse cesarean section  Bilateral Tubal Sterilization Procedure  Surgeon:  Leonard Schwartz, M.D.  Assistant:  Haroldine Laws, certified nurse midwife  Anesthesia:  Spinal  Disposition:  Virginia Page is a 27 y.o. female, [redacted]w[redacted]d, who presents at [redacted]w[redacted]d weeks gestation. The patient has been followed at the [facility] obstetrics and gynecology division of [facility] health care for women. This pregnancy has been complicated by a prior cesarean section. The patient desires a repeat cesarean section and tubal ligation. She understands the indications for her procedure and she accepts the risk of, but not limited to, anesthetic complications, bleeding, infections, possible tubal sterilization failure (10-17/1000), and possible damage to the surrounding organs.  Findings:  A  Female Education officer, environmental) was delivered from a OA position.  The Apgar scores were 9/9. The uterus, fallopian tubes, and ovaries were normal for the gravid state.  Procedure:  The patient was taken to the operating room where a spinal anesthetic was given.The perineum was prepped with betadine. A Foley catheter was placed in the bladder.The patient's abdomen was prepped with Duraprep.   The patient was sterilely draped. A "timeout" was performed which properly identified the patient and the correct operative procedure. The lower abdomen was injected with half percent Marcaine with epinephrine. A low transverse incision was made in the  abdomen and carried sharply through the subcutaneous tissue, the fascia, and the anterior peritoneum. Adhesions were encountered and lysed. An incision was made in the lower uterine segment. The incision was extended in a low transverse fashion. The membranes were ruptured. The fetal head was delivered without difficulty. The mouth and nose were suctioned. The remainder of the infant was then delivered. The cord was clamped and cut. The infant was handed to the awaiting pediatric team. The placenta was removed. The uterine cavity was cleaned of amniotic fluid, clotted blood, and membranes. The uterine incision was closed using a running locking suture of 2-0 Vicryl. An imbricating suture of 2-0 Vicryl was placed. The pelvis was vigorously irrigated. Hemostasis was adequate. The left fallopian tube was identified and followed to its fimbriated end. A knuckle of tube was made on the left using a free-tie and then individual ligatures of 0 plain catgut. The knuckle of tube thus made was excised. Hemostasis was adequate. An identical procedure was carried out on the opposite side. Again hemostasis was adequate. The anterior peritoneum and the abdominal musculature were closed using 2-0 Vicryl. The fascia was closed using a running suture of 0 Vicryl followed by 3 interrupted sutures of 0 Vicryl. The subcutaneous layer was closed using interrupted sutures of 2-0 Vicryl. The skin was reapproximated using a subcuticular suture of 3-0 Monocryl. Sponge, needle, and instrument counts were correct on 2 occasions. The estimated blood loss for the procedure was 600 cc. The patient tolerated her procedure well. She was transported to the recovery room in stable condition. The infant remained in the operating room with the mother for bonding. The placenta was sent to labor and delivery. The cut portions of the fallopian tubes were sent to pathology.  Leonard Schwartz,  M.D.

## 2012-09-04 NOTE — Anesthesia Preprocedure Evaluation (Signed)
Anesthesia Evaluation  Patient identified by MRN, date of birth, ID band Patient awake    Reviewed: Allergy & Precautions, H&P , NPO status , Patient's Chart, lab work & pertinent test results  Airway Mallampati: III TM Distance: >3 FB Neck ROM: Full    Dental no notable dental hx. (+) Teeth Intact   Pulmonary neg pulmonary ROS,    Pulmonary exam normal       Cardiovascular negative cardio ROS  Rhythm:Regular Rate:Normal     Neuro/Psych  Headaches, negative psych ROS   GI/Hepatic negative GI ROS, Neg liver ROS, GERD-  Medicated and Controlled,  Endo/Other  negative endocrine ROS  Renal/GU negative Renal ROS  negative genitourinary   Musculoskeletal negative musculoskeletal ROS (+)   Abdominal (+) + obese,   Peds  Hematology negative hematology ROS (+)   Anesthesia Other Findings   Reproductive/Obstetrics (+) Pregnancy Previous C/Section Desires Permanent Contraception                           Anesthesia Physical Anesthesia Plan  ASA: II  Anesthesia Plan: Spinal   Post-op Pain Management:    Induction:   Airway Management Planned: Natural Airway  Additional Equipment:   Intra-op Plan:   Post-operative Plan: Extubation in OR  Informed Consent: I have reviewed the patients History and Physical, chart, labs and discussed the procedure including the risks, benefits and alternatives for the proposed anesthesia with the patient or authorized representative who has indicated his/her understanding and acceptance.   Dental advisory given  Plan Discussed with: Anesthesiologist, CRNA and Surgeon  Anesthesia Plan Comments: (Last spinal inadequate. Had to be repeated.)        Anesthesia Quick Evaluation

## 2012-09-04 NOTE — Anesthesia Procedure Notes (Signed)
Spinal  Patient location during procedure: OR Start time: 09/04/2012 1:00 PM End time: 09/04/2012 1:03 PM Reason for block: procedure for pain Staffing Anesthesiologist: Sandrea Hughs Performed by: anesthesiologist  Preanesthetic Checklist Completed: patient identified, surgical consent, pre-op evaluation, timeout performed, IV checked, risks and benefits discussed and monitors and equipment checked Spinal Block Patient position: sitting Prep: DuraPrep Patient monitoring: heart rate, cardiac monitor, continuous pulse ox and blood pressure Approach: midline Location: L3-4 Injection technique: single-shot Needle Needle type: Sprotte  Needle gauge: 24 G Needle length: 9 cm Needle insertion depth: 6 cm Assessment Sensory level: T4

## 2012-09-05 ENCOUNTER — Encounter (HOSPITAL_COMMUNITY): Payer: Self-pay | Admitting: Obstetrics and Gynecology

## 2012-09-05 LAB — CBC
HCT: 34.4 % — ABNORMAL LOW (ref 36.0–46.0)
MCH: 30.9 pg (ref 26.0–34.0)
MCV: 89.4 fL (ref 78.0–100.0)
Platelets: 182 10*3/uL (ref 150–400)
RBC: 3.85 MIL/uL — ABNORMAL LOW (ref 3.87–5.11)
RDW: 13.3 % (ref 11.5–15.5)

## 2012-09-05 MED ORDER — COMPLETENATE 29-1 MG PO CHEW
1.0000 | CHEWABLE_TABLET | Freq: Every day | ORAL | Status: DC
  Administered 2012-09-05 – 2012-09-06 (×2): 1 via ORAL
  Filled 2012-09-05 (×2): qty 1

## 2012-09-05 MED ORDER — IBUPROFEN 100 MG/5ML PO SUSP
600.0000 mg | Freq: Four times a day (QID) | ORAL | Status: DC
  Administered 2012-09-05 – 2012-09-06 (×5): 600 mg via ORAL
  Filled 2012-09-05 (×5): qty 30

## 2012-09-05 MED ORDER — IBUPROFEN 100 MG/5ML PO SUSP
600.0000 mg | Freq: Four times a day (QID) | ORAL | Status: DC
  Filled 2012-09-05: qty 30

## 2012-09-05 NOTE — Lactation Note (Signed)
This note was copied from the chart of Virginia Leilanni Halvorson. Lactation Consultation Note: Follow up visit with mom. She reports that baby is not nursing well and she is giving bottles of formula because he is so fussy. Baby is asleep in grandmother's arms. He had formula 2 hours ago. Encouraged to call for assist when baby wakes. No questions at present.  Patient Name: Virginia Sofie Schendel [id number] Date: 09/05/2012 Reason for consult: Follow-up assessment   Maternal Data    Feeding   LATCH Score/Interventions     Lactation Tools Discussed/Used     Consult Status Consult Status: PRN    Pamelia Hoit 09/05/2012, 2:31 PM

## 2012-09-05 NOTE — Anesthesia Postprocedure Evaluation (Signed)
  Anesthesia Post Note  Patient: Virginia Page  Procedure(s) Performed: Procedure(s) (LRB): REPEAT CESAREAN SECTION REPEAT/BILATERAL TUBAL LIGATION (N/A)  Anesthesia type: Spinal  Patient location: Mother/Baby  Post pain: Pain level controlled  Post assessment: Post-op Vital signs reviewed  Last Vitals:  Filed Vitals:   09/05/12 0515  BP: 90/60  Pulse: 81  Temp: 37 C  Resp: 16    Post vital signs: Reviewed  Level of consciousness: awake  Complications: No apparent anesthesia complications

## 2012-09-05 NOTE — Progress Notes (Signed)
Subjective: Postpartum Day 1: Cesarean Delivery and BTL Patient reports that pain is well-managed.Lochia normal.  Ambulating,  tolerating diet as ordered without difficulty. Normal flatus. Absent bowel movement. Desires early D/C home BF  Objective: Vital signs in last 24 hours: Temp:  [97.4 F (36.3 C)-98.6 F (37 C)] 98.6 F (37 C) (08/26 0515) Pulse Rate:  [66-105] 105 (08/26 0920) Resp:  [15-20] 18 (08/26 0920) BP: (83-112)/(26-76) 105/65 mmHg (08/26 0920) SpO2:  [95 %-100 %] 96 % (08/26 0920) Weight:  [205 lb (92.987 kg)] 205 lb (92.987 kg) (08/25 1530)  Physical Exam:  General: alert Lochia: appropriate Uterine Fundus: firm and appropriately tender Incision: healing well DVT Evaluation: No evidence of DVT seen on physical exam. Edema 1+   Recent Labs  09/05/12 0540  HGB 11.9*  HCT 34.4*    Assessment/Plan: Status post Cesarean section. Doing well postoperatively.  Continue current care. Anticipate discharge tomorrow  Virginia Page A 09/05/2012, 11:22 AM

## 2012-09-05 NOTE — Clinical Social Work Maternal (Signed)
    Clinical Social Work Department PSYCHOSOCIAL ASSESSMENT - MATERNAL/CHILD 09/05/2012  Patient:  Virginia Page, Virginia Page  Account Number:  [id number]  Admit Date:  09/04/2012  Marjo Bicker Name:   Dione Plover    Clinical Social Worker:  Nobie Putnam, LCSW   Date/Time:  09/05/2012 11:59 AM  Date Referred:  09/05/2012   Referral source  CN     Referred reason  [facility]   Other referral source:    I:  FAMILY / HOME ENVIRONMENT Child's legal guardian:  PARENT  Guardian - Name Guardian - Age Guardian - Address  Alainah Phang 27 [address] [place]; [place], [place] [address]  Andi Devon 47    Other household support members/support persons Name Relationship DOB   DAUGHTER 26 years old   SON 28 years old   Other support:    II  PSYCHOSOCIAL DATA Information Source:  Patient Interview  Event organiser Employment:   Surveyor, quantity resources:  [employer] If Medicaid - County:  [place] Other  Sales executive  WIC   School / Grade:   Maternity Care Coordinator / Child Services Coordination / Early Interventions:  Cultural issues impacting care:    III  STRENGTHS Strengths  Adequate Resources  Home prepared for Child (including basic supplies)  Supportive family/friends   Strength comment:    IV  RISK FACTORS AND CURRENT PROBLEMS Current Problem:  YES   Risk Factor & Current Problem Patient Issue Family Issue Risk Factor / Current Problem Comment  Other - See comment [facility]    V  SOCIAL WORK ASSESSMENT CSW referral received to assess reason for [facility].  Pt told CSW that she started [facility] at [facility] between 24-26 weeks.  According to pt, she maintained regular care & never missed any appointments.  She denies any illegal substance use & verbalized understanding of hospital drug testing policy.  UDS is negative, meconium results are pending.  Pt has all the necessary supplies for the baby & good family support.  FOB is at the bedside, very attentive to the baby &  appears to be supportive.  Pt appears to be appropriate at this time.  CSW will monitor drug screen results & make a report if necessary.      VI SOCIAL WORK PLAN Social Work Plan  No Further Intervention Required / No Barriers to Discharge   Type of pt/family education:   If child protective services report - county:   If child protective services report - date:   Information/referral to community resources comment:   Other social work plan:

## 2012-09-05 NOTE — Progress Notes (Signed)
UR chart review completed.  

## 2012-09-06 MED ORDER — OXYCODONE-ACETAMINOPHEN 5-325 MG PO TABS: 1.0000 | ORAL_TABLET | Freq: Four times a day (QID) | ORAL | Status: DC | PRN

## 2012-09-06 MED ORDER — IBUPROFEN 600 MG PO TABS: 600.0000 mg | ORAL_TABLET | Freq: Four times a day (QID) | ORAL | Status: DC | PRN

## 2012-09-06 NOTE — Discharge Summary (Addendum)
Obstetric Discharge Summary Reason for Admission: cesarean section Prenatal Procedures: ultrasound Intrapartum Procedures: repeat c-section and BTL Postpartum Procedures: none Complications-Operative and Postpartum: none Hemoglobin  Date Value Range Status  09/05/2012 11.9* 12.0 - 15.0 g/dL Final     HCT  Date Value Range Status  09/05/2012 34.4* 36.0 - 46.0 % Final   Plans outpatient circumcision and is attempting to breast feed  Physical Exam:  General: alert and no distress Lochia: appropriate Uterine Fundus: firm Incision: healing well, dressing in place DVT Evaluation: No evidence of DVT seen on physical exam.  Discharge Diagnoses: Term Pregnancy-delivered  Discharge Information: Date: 09/06/2012 Activity: pelvic rest Diet: routine Medications: Ibuprofen and Percocet Condition: stable Instructions: refer to practice specific booklet Discharge to: home Follow-up Information   Follow up with [facility] In 6 weeks. (for post partum visit)    Contact information:   [address] [place] [address]       Newborn Data: Live born female  Birth Weight: 8 lb 2.2 oz (3690 g) APGAR: 9, 9  Home with mother.  Virginia Page 09/06/2012, 10:31 AM

## 2012-09-06 NOTE — Lactation Note (Signed)
This note was copied from the chart of Virginia Lavaughn Bisig. Lactation Consultation Note Mom states that baby has difficulty latching, but that she is pumping without difficulty and baby takes expressed breastmilk in bottle. Mom intends to continue using her hand pump until her [facility] appt, and offering bottles of pumped br milk and formula.  Discussed milk storage guidelines, reviewed in baby and me book.  Enc mom to call lactation office if she has any concerns, and to attend the BFSG.  Patient Name: Virginia Page [id number] Date: 09/06/2012     Maternal Data    Feeding Feeding Type: Formula Nipple Type: Slow - flow  LATCH Score/Interventions                      Lactation Tools Discussed/Used     Consult Status      Lenard Forth 09/06/2012, 11:44 AM

## 2013-05-16 ENCOUNTER — Telehealth: Payer: Self-pay | Admitting: Emergency Medicine

## 2013-05-16 NOTE — Telephone Encounter (Signed)
Pt called and would like a referral to [facility]. jw

## 2013-05-17 NOTE — Telephone Encounter (Signed)
She has not been seen here for over 1.5 years.  She will need to make an appointment with me before I can do the referral.

## 2013-05-17 NOTE — Telephone Encounter (Signed)
LM on identifiable VM for patient to please call and schedule an OV.  Radene OuKristen L [place] Gustafson, CMA

## 2013-05-30 ENCOUNTER — Ambulatory Visit (INDEPENDENT_AMBULATORY_CARE_PROVIDER_SITE_OTHER): Payer: Medicaid Other | Admitting: Emergency Medicine

## 2013-05-30 ENCOUNTER — Encounter: Payer: Self-pay | Admitting: Emergency Medicine

## 2013-05-30 VITALS — BP 126/84 | HR 71 | Ht 62.0 in | Wt 206.0 lb

## 2013-05-30 DIAGNOSIS — N949 Unspecified condition associated with female genital organs and menstrual cycle: Secondary | ICD-10-CM

## 2013-05-30 DIAGNOSIS — K5900 Constipation, unspecified: Secondary | ICD-10-CM

## 2013-05-30 DIAGNOSIS — R102 Pelvic and perineal pain: Secondary | ICD-10-CM

## 2013-05-30 NOTE — Assessment & Plan Note (Signed)
This is likely the cause of her nagging lower abdominal pain. Discussed MiraLAX and how to titrate it. Followup as needed.

## 2013-05-30 NOTE — Patient Instructions (Signed)
It was nice to see you!  The nagging pain is likely coming from constipation. Get MiraLAX at the drugstore.  Take it once a day until you are having 1 soft bowel movement a day.  If you haven't had a bowel movement after 3-4 days, increase the miralax to twice a day.  Once you are having regular bowel movements, you can back off on the miralax.  I will put in a referral to your OB/GYN so they can evaluate the pain you are having.  It seems like it is related to your scar rather than something inside the pelvis.  I will leave it up to the OB/GYN to restart your birth control.  If you haven't heard from the OB/GYN in the next 2 weeks, let me know and I will start the birth control.

## 2013-05-30 NOTE — Assessment & Plan Note (Signed)
I suspect this is more in the muscle or soft tissue layer based on her exam. Intrapelvic pathology if possible given her your regular and heavy menstrual periods. However I feel this is unlikely. I will hold off on restarting OCPs for regulation of periods until she has been seen [facility]. Will refer back to [facility] as they did her surgery.

## 2013-05-30 NOTE — Progress Notes (Signed)
   Subjective:    Patient ID: Virginia Page, female    DOB: 02/15/1985, 28 y.o.   MRN: [id number]  HPI Virginia Page is here for pelvic pain and OB referral.  She states that she has been having lower abdominal pain since her son was born 8 months ago. She had a C-section and tubal ligation at that time. She states since then she has had a nagging pain on a daily basis located in the lower abdomen. She also has a jolting, contraction-like, pain when anybody touches her abdomen by her C-section scar. Her last period was May 10. Her cycle is somewhat irregular lasting anywhere from 18-28 days. It can be very light to very heavy with clots. She has bad cramps with her period. She states these are similar to the menstrual periods she had prior to having children and prior to being on birth control. She also reports a history of constipation since her son was born. She has a bowel movement once a week. The stool is very hard and she strains a lot. She has not seen any blood in the stool. She states the nagging pain in her lower belly improves after a bowel movement, then gradually returns 2-3 days later.  No current outpatient prescriptions on file prior to visit.   No current facility-administered medications on file prior to visit.    I have reviewed and updated the following as appropriate: allergies and current medications SHx: non smoker   Review of Systems See HPI    Objective:   Physical Exam BP 126/84  Pulse 71  Ht 5\' 2"  (1.575 m)  Wt 206 lb (93.441 kg)  BMI 37.67 kg/m2 Gen: alert, cooperative, NAD Abd: +BS, soft, ND, tender to firm palpation at either end of her c-section scar, this tenderness is present with and without the abdominal muscles engaged.  Scar is well healed.      Assessment & Plan:

## 2013-11-12 ENCOUNTER — Encounter: Payer: Self-pay | Admitting: Emergency Medicine

## 2014-02-21 ENCOUNTER — Emergency Department (HOSPITAL_COMMUNITY)
Admission: EM | Admit: 2014-02-21 | Discharge: 2014-02-21 | Disposition: A | Discharge status: Home / Self Care | Payer: Medicaid Other | Source: Home / Self Care | Attending: Family Medicine | Admitting: Family Medicine

## 2014-02-21 ENCOUNTER — Encounter (HOSPITAL_COMMUNITY): Payer: Self-pay | Admitting: Emergency Medicine

## 2014-02-21 DIAGNOSIS — K088 Other specified disorders of teeth and supporting structures: Secondary | ICD-10-CM

## 2014-02-21 DIAGNOSIS — K0889 Other specified disorders of teeth and supporting structures: Secondary | ICD-10-CM

## 2014-02-21 MED ORDER — TRAMADOL HCL 50 MG PO TABS: 50.0000 mg | ORAL_TABLET | Freq: Four times a day (QID) | ORAL | Status: DC | PRN

## 2014-02-21 MED ORDER — AMOXICILLIN 500 MG PO CAPS: 500.0000 mg | ORAL_CAPSULE | Freq: Three times a day (TID) | ORAL | Status: DC

## 2014-02-21 NOTE — ED Provider Notes (Signed)
Virginia Page is a 29 y.o. female who presents to Urgent Care today for right lower tooth pain present for 3 days. Patient has a dentist appointment right lower tooth pain present for 3 days. Patient has a dentist appointment next week. She has tried ibuprofen and Aleve and Tylenol which have helped a bit. No fevers or chills vomiting or diarrhea. Pain is worse with chewing and severe.   Past Medical History  Diagnosis Date  . Migraine with aura   . Complication of anesthesia     failed spinal anes last delivery   Past Surgical History  Procedure Laterality Date  . Cesarean section    . Eye surgery    . Hernia repair    . Cesarean section with bilateral tubal ligation N/A 09/04/2012    Procedure: REPEAT CESAREAN SECTION REPEAT/BILATERAL TUBAL LIGATION;  Surgeon: Kirkland HunArthur Stringer, MD;  Location: [facility];  Service: Obstetrics;  Laterality: N/A;  Repeat C/S   History  Substance Use Topics  . Smoking status: Never Smoker   . Smokeless tobacco: Never Used  . Alcohol Use: No   ROS as above Medications: No current facility-administered medications for this encounter.   Current Outpatient Prescriptions  Medication Sig Dispense Refill  . amoxicillin (AMOXIL) 500 MG capsule Take 1 capsule (500 mg total) by mouth 3 (three) times daily. 30 capsule 0  . traMADol (ULTRAM) 50 MG tablet Take 1 tablet (50 mg total) by mouth every 6 (six) hours as needed. 15 tablet 0   Allergies  Allergen Reactions  . Demerol [Meperidine] Hives and Other (See Comments)    Chills and halucinations     Exam:  BP 128/82 mmHg  Pulse 82  Temp(Src) 98.4 F (36.9 C) (Oral)  Resp 16  SpO2 97%  LMP 02/14/2014 Gen: Well NAD HEENT: EOMI,  MMM normal-appearing teeth throughout. Tender to palpation right rear most lower molar. No significant gum swelling. Lungs: Normal work of breathing. CTABL Heart: RRR no MRG Abd: NABS, Soft. Nondistended, Nontender Exts: Brisk capillary refill, warm and well perfused.   No  results found for this or any previous visit (from the past 24 hour(s)). No results found.  Assessment and Plan: 29 y.o. female with dental pain treat with tramadol and amoxicillin. Follow-up with dentist.  Discussed warning signs or symptoms. Please see discharge instructions. Patient expresses understanding.     Rodolph BongEvan S Corey, MD 02/21/14 1910

## 2014-02-21 NOTE — Discharge Instructions (Signed)
Thank you for coming in today. °Follow up with your dentist.  ° ° °Dental Pain °A tooth ache may be caused by cavities (tooth decay). Cavities expose the nerve of the tooth to air and hot or cold temperatures. It may come from an infection or abscess (also called a boil or furuncle) around your tooth. It is also often caused by dental caries (tooth decay). This causes the pain you are having. °DIAGNOSIS  °Your caregiver can diagnose this problem by exam. °TREATMENT  °· If caused by an infection, it may be treated with medications which kill germs (antibiotics) and pain medications as prescribed by your caregiver. Take medications as directed. °· Only take over-the-counter or prescription medicines for pain, discomfort, or fever as directed by your caregiver. °· Whether the tooth ache today is caused by infection or dental disease, you should see your dentist as soon as possible for further care. °SEEK MEDICAL CARE IF: °The exam and treatment you received today has been provided on an emergency basis only. This is not a substitute for complete medical or dental care. If your problem worsens or new problems (symptoms) appear, and you are unable to meet with your dentist, call or return to this location. °SEEK IMMEDIATE MEDICAL CARE IF:  °· You have a fever. °· You develop redness and swelling of your face, jaw, or neck. °· You are unable to open your mouth. °· You have severe pain uncontrolled by pain medicine. °MAKE SURE YOU:  °· Understand these instructions. °· Will watch your condition. °· Will get help right away if you are not doing well or get worse. °Document Released: 12/28/2004 Document Revised: 03/22/2011 Document Reviewed: 08/16/2007 °ExitCare® Patient Information ©2015 [employer]. This information is not intended to replace advice given to you by your health care provider. Make sure you discuss any questions you have with your health care provider. ° °

## 2014-02-21 NOTE — ED Notes (Signed)
Pt states that she has a lower right tooth pain, she states that she has a dental appointment next week but with the pain she cant wait to be seen.

## 2014-06-18 ENCOUNTER — Ambulatory Visit: Payer: Medicaid Other | Admitting: Family Medicine

## 2014-12-27 ENCOUNTER — Emergency Department (HOSPITAL_COMMUNITY)
Admission: EM | Admit: 2014-12-27 | Discharge: 2014-12-27 | Discharge status: Absent without leave | Payer: Medicaid Other | Source: Home / Self Care

## 2015-01-23 ENCOUNTER — Emergency Department (HOSPITAL_COMMUNITY)
Admission: EM | Admit: 2015-01-23 | Discharge: 2015-01-23 | Disposition: A | Discharge status: Home / Self Care | Payer: Self-pay | Attending: Emergency Medicine | Admitting: Emergency Medicine

## 2015-01-23 ENCOUNTER — Emergency Department (HOSPITAL_COMMUNITY): Payer: Medicaid Other

## 2015-01-23 ENCOUNTER — Encounter (HOSPITAL_COMMUNITY): Payer: Self-pay | Admitting: *Deleted

## 2015-01-23 DIAGNOSIS — R05 Cough: Secondary | ICD-10-CM | POA: Insufficient documentation

## 2015-01-23 DIAGNOSIS — R1110 Vomiting, unspecified: Secondary | ICD-10-CM | POA: Insufficient documentation

## 2015-01-23 DIAGNOSIS — Z792 Long term (current) use of antibiotics: Secondary | ICD-10-CM | POA: Insufficient documentation

## 2015-01-23 DIAGNOSIS — M546 Pain in thoracic spine: Secondary | ICD-10-CM | POA: Insufficient documentation

## 2015-01-23 DIAGNOSIS — R059 Cough, unspecified: Secondary | ICD-10-CM

## 2015-01-23 DIAGNOSIS — Z8679 Personal history of other diseases of the circulatory system: Secondary | ICD-10-CM | POA: Insufficient documentation

## 2015-01-23 MED ORDER — METHYLPREDNISOLONE 4 MG PO TBPK: ORAL_TABLET | ORAL | Status: DC

## 2015-01-23 NOTE — Discharge Instructions (Signed)
Please read and follow all provided instructions.  Your diagnoses today include:  1. Cough     Tests performed today include:  Vital signs. See below for your results today.   Medications prescribed:   Medrol Dose Pack. Take As prescribed   Home care instructions:  Follow any educational materials contained in this packet.  Follow-up instructions: Please follow-up with your primary care provider in the next 48 hours for further evaluation of symptoms and treatment   Return instructions:   Please return to the Emergency Department if you do not get better, if you get worse, or new symptoms OR  - Fever (temperature greater than 101.88F)  - Bleeding that does not stop with holding pressure to the area    -Severe pain (please note that you may be more sore the day after your accident)  - Chest Pain  - Difficulty breathing  - Severe nausea or vomiting  - Inability to tolerate food and liquids  - Passing out  - Skin becoming red around your wounds  - Change in mental status (confusion or lethargy)  - New numbness or weakness     Please return if you have any other emergent concerns.  Additional Information:  Your vital signs today were: BP 124/79 mmHg   Pulse 83   Temp(Src) 98 F (36.7 C) (Oral)   Resp 18   Ht 5\' 3"  (1.6 m)   Wt 93.243 kg   BMI 36.42 kg/m2   SpO2 98%   LMP 01/12/2015 If your blood pressure (BP) was elevated above 135/85 this visit, please have this repeated by your doctor within one month. ---------------

## 2015-01-23 NOTE — ED Provider Notes (Signed)
CSN: [id number]     Arrival date & time 01/23/15  0920 History  By signing my name below, I, Jarvis Morganaylor Ferguson, attest that this documentation has been prepared under the direction and in the presence of Audry Piliyler Christa Fasig, PA-C Electronically Signed: Jarvis Morganaylor Ferguson, ED Scribe. 01/23/2015. 10:44 AM.      Chief Complaint  Patient presents with  . Cough   The history is provided by the patient. No language interpreter was used.    HPI Comments: Virginia Page is a 30 y.o. female with a h/o seasonal allergies who presents to the Emergency Department complaining of an intermittent, moderate, gradually worsening, dry cough for 2 months. Pt notes this cough is not related to activity. She reports associated post-tussive emesis, upper back pain and HAs. Pt notes that she has a h/o migraines and the cough has been exacerbating this. She endorses h/o similar cough that she usually gets 1x per year. She states she has been taking OTC medications and home remedies with no significant relief. She is a non smoker. She denies any fever, rhinorrhea, otalgia, sore throat, SOB or other associated symptoms at this time.  Past Medical History  Diagnosis Date  . Migraine with aura   . Complication of anesthesia     failed spinal anes last delivery   Past Surgical History  Procedure Laterality Date  . Cesarean section    . Eye surgery    . Hernia repair    . Cesarean section with bilateral tubal ligation N/A 09/04/2012    Procedure: REPEAT CESAREAN SECTION REPEAT/BILATERAL TUBAL LIGATION;  Surgeon: Kirkland HunArthur Stringer, MD;  Location: [facility];  Service: Obstetrics;  Laterality: N/A;  Repeat C/S   Family History  Problem Relation Age of Onset  . Heart disease Father   . Diabetes Father   . Hyperlipidemia Father   . Hypertension Father   . Kidney disease Father   . Diabetes Maternal Aunt   . Diabetes Maternal Uncle    Social History  Substance Use Topics  . Smoking status: Never Smoker   . Smokeless tobacco: Never  Used  . Alcohol Use: No   OB History    Gravida Para Term Preterm AB TAB SAB Ectopic Multiple Living   3 3 1       3      Review of Systems  Constitutional: Negative for fever.  HENT: Negative for ear pain and rhinorrhea.   Respiratory: Positive for cough. Negative for shortness of breath.   Musculoskeletal: Positive for back pain.  Neurological: Positive for headaches.  10 Systems reviewed and all are negative for acute change except as noted in the HPI.  Allergies  Demerol  Home Medications   Prior to Admission medications   Medication Sig Start Date End Date Taking? Authorizing Provider  amoxicillin (AMOXIL) 500 MG capsule Take 1 capsule (500 mg total) by mouth 3 (three) times daily. 02/21/14   Rodolph BongEvan S Corey, MD  traMADol (ULTRAM) 50 MG tablet Take 1 tablet (50 mg total) by mouth every 6 (six) hours as needed. 02/21/14   Rodolph BongEvan S Corey, MD   Triage Vitals: BP 124/79 mmHg  Pulse 83  Temp(Src) 98 F (36.7 C) (Oral)  Resp 18  Ht 5\' 3"  (1.6 m)  Wt 205 lb 9 oz (93.243 kg)  BMI 36.42 kg/m2  SpO2 98%  Physical Exam  Constitutional: She is oriented to person, place, and time. She appears well-developed and well-nourished. No distress.  HENT:  Head: Normocephalic and atraumatic.  Right Ear: Tympanic  membrane, external ear and ear canal normal.  Left Ear: Tympanic membrane, external ear and ear canal normal.  Nose: Nose normal.  Mouth/Throat: Uvula is midline, oropharynx is clear and moist and mucous membranes are normal. No oropharyngeal exudate.  Eyes: Conjunctivae and EOM are normal.  Neck: Neck supple. No tracheal deviation present.  Cardiovascular: Normal rate, regular rhythm and normal heart sounds.   Pulmonary/Chest: Effort normal and breath sounds normal. No respiratory distress. She has no wheezes.  Abdominal: Soft.  Musculoskeletal: Normal range of motion.  Neurological: She is alert and oriented to person, place, and time.  Skin: Skin is warm and dry.  Psychiatric:  She has a normal mood and affect. Her behavior is normal. Thought content normal.  Nursing note and vitals reviewed.   ED Course  Procedures (including critical care time)  DIAGNOSTIC STUDIES: Oxygen Saturation is 98% on RA, normal by my interpretation.    COORDINATION OF CARE: 10:47 AM- Will order CXR. Pt advised of plan for treatment and pt agrees.  Labs Review Labs Reviewed - No data to display  Imaging Review No results found. I have personally reviewed and evaluated these images and lab results as part of my medical decision-making.   EKG Interpretation None      MDM  I have reviewed relevant imaging studies. I have reviewed the relevant previous healthcare records.I obtained HPI from historian.  ED Course: CXR  Assessment: 29y f with no sig pmh presents with chronic cough for 2+ months. Only reported symptoms is a cough. No rhinorrhea, congestion, fever, dyspnea. On exam, TM clear, posterior pharynx unremarkable, lungs CTA bilaterally. No recent travel reported. I ordered an CXR to evaluate for an signs of TB, which was negative.  I will prescribe a medrol dose back as well as follow up with PCP for further management of symptoms. Most likely cough is allergy related.   Patient is in no acute distress. Vital Signs are stable. Patient is able to ambulate. Patient able to tolerate PO.          Disposition/Plan:  DC Home Additional Verbal discharge instructions given and discussed with patient.  Pt Instructed to f/u with PCP in the next 48 hours for evaluation and treatment of symptoms. Return precautions given Pt acknowledges and agrees with plan  Supervising Physician Melene Plan, DO   Final diagnoses:  Cough    I personally performed the services described in this documentation, which was scribed in my presence. The recorded information has been reviewed and is accurate.   Audry Pili, PA-C 01/23/15 1257  Melene Plan, DO 01/23/15 1349

## 2015-01-23 NOTE — ED Notes (Signed)
Declined W/C at D/C and was escorted to lobby by RN. 

## 2015-01-23 NOTE — ED Notes (Signed)
Pt reports a cough since november. Pt reports hx of allergies. Pt states that the cough has worsened recently.

## 2017-05-13 ENCOUNTER — Encounter: Payer: Self-pay | Admitting: Family Medicine

## 2017-05-13 ENCOUNTER — Ambulatory Visit: Payer: Self-pay | Admitting: Family Medicine

## 2017-05-13 VITALS — BP 118/80 | HR 79 | Temp 98.7°F | Resp 18 | Ht 64.5 in | Wt 221.8 lb

## 2017-05-13 DIAGNOSIS — Z021 Encounter for pre-employment examination: Secondary | ICD-10-CM

## 2017-05-13 NOTE — Progress Notes (Signed)
Virginia Page is a 32 y.o. 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 She denies any acute or chronic conditions. She does report she is in need of new glasses and has has a eye operation in her past. She reports she is healthy.  Review of Systems  Constitutional: Negative for chills, fever and malaise/fatigue.  HENT: Negative for congestion, ear discharge, ear pain, sinus pain and sore throat.   Eyes: Negative.        Report astigmatism  Respiratory: Negative for cough, sputum production and shortness of breath.   Cardiovascular: Negative.  Negative for chest pain.  Gastrointestinal: Negative for abdominal pain, diarrhea, nausea and vomiting.  Genitourinary: Negative for dysuria, frequency, hematuria and urgency.  Musculoskeletal: Negative for myalgias.  Skin: Negative.   Neurological: Negative for headaches.  Endo/Heme/Allergies: Negative.   Psychiatric/Behavioral: Negative.     O:   Vitals:   05/13/17 0834  BP: 118/80  Pulse: 79  Resp: 18  Temp: 98.7 F (37.1 C)  SpO2: 98%   Physical Exam  Constitutional: She is oriented to person, place, and time. Vital signs are normal. She appears well-developed and well-nourished. She is active.  Non-toxic appearance. She does not have a sickly appearance.  Patient is overweight.  HENT:  Head: Normocephalic.  Right Ear: Hearing, tympanic membrane, external ear and ear canal normal.  Left Ear: Hearing, tympanic membrane, external ear and ear canal normal.  Nose: Nose normal.  Mouth/Throat: Uvula is midline and oropharynx is clear and moist.  Neck: Normal range of motion. Neck supple.  Cardiovascular: Normal rate, regular rhythm, normal heart sounds and normal pulses.  Pulmonary/Chest: Effort normal and breath sounds normal.  Abdominal: Soft. Bowel sounds are normal.  Musculoskeletal: Normal range of motion.  Patient able to touch toes and lift lightweight chair with full ROM and no difficulty   Lymphadenopathy:       Head (right side): No submental and no submandibular adenopathy present.       Head (left side): No submental and no submandibular adenopathy present.    She has no cervical adenopathy.  Neurological: She is alert and oriented to person, place, and time.  Psychiatric: She has a normal mood and affect.  Vitals reviewed.   A: 1. Physical exam, pre-employment    P: 1. Physical exam, pre-employment Exam completed no acute findings- form filled out and scanned into records, patient provided original copy. No immunizations or TB verified patient advised to utilize local health department for records. Patient verbalized understanding of information provided and agrees with POC at this time.

## 2017-05-13 NOTE — Patient Instructions (Signed)
Exercising to Stay Healthy Exercising regularly is important. It has many health benefits, such as:  Improving your overall fitness, flexibility, and endurance.  Increasing your bone density.  Helping with weight control.  Decreasing your body fat.  Increasing your muscle strength.  Reducing stress and tension.  Improving your overall health.  In order to become healthy and stay healthy, it is recommended that you do moderate-intensity and vigorous-intensity exercise. You can tell that you are exercising at a moderate intensity if you have a higher heart rate and faster breathing, but you are still able to hold a conversation. You can tell that you are exercising at a vigorous intensity if you are breathing much harder and faster and cannot hold a conversation while exercising. How often should I exercise? Choose an activity that you enjoy and set realistic goals. Your health care provider can help you to make an activity plan that works for you. Exercise regularly as directed by your health care provider. This may include:  Doing resistance training twice each week, such as: ? Push-ups. ? Sit-ups. ? Lifting weights. ? Using resistance bands.  Doing a given intensity of exercise for a given amount of time. Choose from these options: ? 150 minutes of moderate-intensity exercise every week. ? 75 minutes of vigorous-intensity exercise every week. ? A mix of moderate-intensity and vigorous-intensity exercise every week.  Children, pregnant women, people who are out of shape, people who are overweight, and older adults may need to consult a health care provider for individual recommendations. If you have any sort of medical condition, be sure to consult your health care provider before starting a new exercise program. What are some exercise ideas? Some moderate-intensity exercise ideas include:  Walking at a rate of 1 mile in 15  minutes.  Biking.  Hiking.  Golfing.  Dancing.  Some vigorous-intensity exercise ideas include:  Walking at a rate of at least 4.5 miles per hour.  Jogging or running at a rate of 5 miles per hour.  Biking at a rate of at least 10 miles per hour.  Lap swimming.  Roller-skating or in-line skating.  Cross-country skiing.  Vigorous competitive sports, such as football, basketball, and soccer.  Jumping rope.  Aerobic dancing.  What are some everyday activities that can help me to get exercise?  Yard work, such as: ? Pushing a lawn mower. ? Raking and bagging leaves.  Washing and waxing your car.  Pushing a stroller.  Shoveling snow.  Gardening.  Washing windows or floors. How can I be more active in my day-to-day activities?  Use the stairs instead of the elevator.  Take a walk during your lunch break.  If you drive, park your car farther away from work or school.  If you take public transportation, get off one stop early and walk the rest of the way.  Make all of your phone calls while standing up and walking around.  Get up, stretch, and walk around every 30 minutes throughout the day. What guidelines should I follow while exercising?  Do not exercise so much that you hurt yourself, feel dizzy, or get very short of breath.  Consult your health care provider before starting a new exercise program.  Wear comfortable clothes and shoes with good support.  Drink plenty of water while you exercise to prevent dehydration or heat stroke. Body water is lost during exercise and must be replaced.  Work out until you breathe faster and your heart beats faster. This information is not   intended to replace advice given to you by your health care provider. Make sure you discuss any questions you have with your health care provider. Document Released: 01/30/2010 Document Revised: 06/05/2015 Document Reviewed: 05/31/2013 Elsevier Interactive Patient Education  2018  [employer]  

## 2017-09-29 ENCOUNTER — Emergency Department (HOSPITAL_COMMUNITY): Payer: Self-pay

## 2017-09-29 ENCOUNTER — Other Ambulatory Visit: Payer: Self-pay

## 2017-09-29 ENCOUNTER — Emergency Department (HOSPITAL_COMMUNITY)
Admission: EM | Admit: 2017-09-29 | Discharge: 2017-09-29 | Disposition: A | Discharge status: Home / Self Care | Payer: Self-pay | Attending: Emergency Medicine | Admitting: Emergency Medicine

## 2017-09-29 ENCOUNTER — Encounter (HOSPITAL_COMMUNITY): Payer: Self-pay

## 2017-09-29 DIAGNOSIS — R600 Localized edema: Secondary | ICD-10-CM | POA: Insufficient documentation

## 2017-09-29 DIAGNOSIS — R609 Edema, unspecified: Secondary | ICD-10-CM

## 2017-09-29 LAB — BRAIN NATRIURETIC PEPTIDE: B NATRIURETIC PEPTIDE 5: 16.9 pg/mL (ref 0.0–100.0)

## 2017-09-29 LAB — COMPREHENSIVE METABOLIC PANEL
ALK PHOS: 56 U/L (ref 38–126)
ALT: 25 U/L (ref 0–44)
AST: 29 U/L (ref 15–41)
Albumin: 3.6 g/dL (ref 3.5–5.0)
Anion gap: 8 (ref 5–15)
BUN: 6 mg/dL (ref 6–20)
CALCIUM: 8.7 mg/dL — AB (ref 8.9–10.3)
CO2: 26 mmol/L (ref 22–32)
CREATININE: 0.89 mg/dL (ref 0.44–1.00)
Chloride: 104 mmol/L (ref 98–111)
Glucose, Bld: 109 mg/dL — ABNORMAL HIGH (ref 70–99)
Potassium: 3.8 mmol/L (ref 3.5–5.1)
Sodium: 138 mmol/L (ref 135–145)
Total Bilirubin: 1 mg/dL (ref 0.3–1.2)
Total Protein: 6.6 g/dL (ref 6.5–8.1)

## 2017-09-29 LAB — CBC WITH DIFFERENTIAL/PLATELET
Abs Immature Granulocytes: 0 10*3/uL (ref 0.0–0.1)
BASOS PCT: 1 %
Basophils Absolute: 0.1 10*3/uL (ref 0.0–0.1)
EOS ABS: 0.1 10*3/uL (ref 0.0–0.7)
Eosinophils Relative: 1 %
HCT: 40.2 % (ref 36.0–46.0)
Hemoglobin: 13.5 g/dL (ref 12.0–15.0)
IMMATURE GRANULOCYTES: 0 %
Lymphocytes Relative: 31 %
Lymphs Abs: 2.4 10*3/uL (ref 0.7–4.0)
MCH: 30.5 pg (ref 26.0–34.0)
MCHC: 33.6 g/dL (ref 30.0–36.0)
MCV: 90.7 fL (ref 78.0–100.0)
MONOS PCT: 5 %
Monocytes Absolute: 0.4 10*3/uL (ref 0.1–1.0)
NEUTROS PCT: 62 %
Neutro Abs: 4.9 10*3/uL (ref 1.7–7.7)
PLATELETS: 271 10*3/uL (ref 150–400)
RBC: 4.43 MIL/uL (ref 3.87–5.11)
RDW: 11.9 % (ref 11.5–15.5)
WBC: 7.8 10*3/uL (ref 4.0–10.5)

## 2017-09-29 NOTE — ED Notes (Signed)
Pt verbalized understanding of d/c instructions and no further questions, VSS, NAD.

## 2017-09-29 NOTE — ED Notes (Signed)
Pt does not want to have preg test. She has not had sex for 3 years.

## 2017-09-29 NOTE — Discharge Instructions (Signed)
Wear compression stockings as directed.  Please control your salt intake.  Elevate your extremities when you feel like they are more swollen. Return to the ED if you start to develop chest pain, shortness of breath especially with walking, increased swelling or redness in one leg.

## 2017-09-29 NOTE — ED Triage Notes (Signed)
Pt states that she has been having leg selling for weeks, denies injury, has palpable pulse 2+ in each foot.

## 2017-09-29 NOTE — ED Provider Notes (Signed)
[facility] EMERGENCY DEPARTMENT Provider Note   CSN: [id number] Arrival date & time: 09/29/17  4098     History   Chief Complaint Chief Complaint  Patient presents with  . Leg Swelling    HPI Virginia Page is a 32 y.o. female who presents to ED for evaluation of intermittent bilateral lower extremity edema for the past several months.  States that she is here today because the symptoms got worse this week.  However, on arrival to the ED this morning, she states that symptoms have improved.  She states that the edema worsens with certain activities and her salt intake.  She usually controls it with elevation and changing her diet.  She is concerned because this week, the symptoms have lasted longer.  She states that symptoms are usually symmetrical in lower extremities but sometimes right greater than left.  She denies any shortness of breath, chest pain, history of DVT, recent surgeries, recent prolonged travel, hormone use, hemoptysis.  HPI  Past Medical History:  Diagnosis Date  . Complication of anesthesia    failed spinal anes last delivery  . Migraine with aura     Patient Active Problem List   Diagnosis Date Noted  . Constipation 05/30/2013  . Pelvic pain in female 05/30/2013  . Contraception management 08/30/2011  . Migraine with aura 08/30/2011  . Annual physical exam 08/30/2011  . Obesity (BMI 30.0-34.9) 08/30/2011    Past Surgical History:  Procedure Laterality Date  . CESAREAN SECTION    . CESAREAN SECTION WITH BILATERAL TUBAL LIGATION N/A 09/04/2012   Procedure: REPEAT CESAREAN SECTION REPEAT/BILATERAL TUBAL LIGATION;  Surgeon: Kirkland Hun, MD;  Location: [facility];  Service: Obstetrics;  Laterality: N/A;  Repeat C/S  . EYE SURGERY    . HERNIA REPAIR       OB History    Gravida  3   Para  3   Term  1   Preterm      AB      Living  3     SAB      TAB      Ectopic      Multiple      Live Births  1             Home Medications    Prior to Admission medications   Medication Sig Start Date End Date Taking? Authorizing Provider  amoxicillin (AMOXIL) 500 MG capsule Take 1 capsule (500 mg total) by mouth 3 (three) times daily. Patient not taking: Reported on 01/23/2015 02/21/14   Rodolph Bong, MD  methylPREDNISolone (MEDROL DOSEPAK) 4 MG TBPK tablet Day 1: 8 mg PO before breakfast, 4 mg after lunch and after dinner, and 8 mg at bedtime Day 2: 4 mg PO before breakfast, after lunch, and after dinner and 8 mg at bedtime Day 3: 4 mg PO before breakfast, after lunch, after dinner, and at bedtime Day 4: 4 mg PO before breakfast, after lunch, and at bedtime Day 5: 4 mg PO before breakfast and at bedtime Day 6: 4 mg PO before breakfast Patient not taking: Reported on 05/13/2017 01/23/15   Audry Pili, PA-C  traMADol (ULTRAM) 50 MG tablet Take 1 tablet (50 mg total) by mouth every 6 (six) hours as needed. Patient not taking: Reported on 05/13/2017 02/21/14   Rodolph Bong, MD    Family History Family History  Problem Relation Age of Onset  . Heart disease Father   . Diabetes Father   .  Hyperlipidemia Father   . Hypertension Father   . Kidney disease Father   . Diabetes Maternal Aunt   . Diabetes Maternal Uncle     Social History Social History   Tobacco Use  . Smoking status: Never Smoker  . Smokeless tobacco: Never Used  Substance Use Topics  . Alcohol use: No  . Drug use: No     Allergies   Demerol [meperidine]   Review of Systems Review of Systems  Constitutional: Negative for appetite change, chills and fever.  HENT: Negative for ear pain, rhinorrhea, sneezing and sore throat.   Eyes: Negative for photophobia and visual disturbance.  Respiratory: Negative for cough, chest tightness, shortness of breath and wheezing.   Cardiovascular: Positive for leg swelling. Negative for chest pain and palpitations.  Gastrointestinal: Negative for abdominal pain, blood in stool, constipation,  diarrhea, nausea and vomiting.  Genitourinary: Negative for dysuria, hematuria and urgency.  Musculoskeletal: Negative for myalgias.  Skin: Negative for rash.  Neurological: Negative for dizziness, weakness and light-headedness.     Physical Exam Updated Vital Signs BP 122/84   Pulse 79   Temp 98.4 F (36.9 C) (Oral)   Resp 18   Ht 5\' 4"  (1.626 m)   Wt 95.3 kg   LMP 07/29/2017 Comment: Tubal removal   SpO2 100%   BMI 36.05 kg/m   Physical Exam  Constitutional: She appears well-developed and well-nourished. No distress.  HENT:  Head: Normocephalic and atraumatic.  Nose: Nose normal.  Eyes: Conjunctivae and EOM are normal. Right eye exhibits no discharge. Left eye exhibits no discharge. No scleral icterus.  Neck: Normal range of motion. Neck supple.  Cardiovascular: Normal rate, regular rhythm, normal heart sounds and intact distal pulses. Exam reveals no gallop and no friction rub.  No murmur heard. Pulmonary/Chest: Effort normal and breath sounds normal. No respiratory distress.  Abdominal: Soft. Bowel sounds are normal. She exhibits no distension. There is no tenderness. There is no guarding.  Musculoskeletal: Normal range of motion. She exhibits no edema.  Very minimal, nonpitting edema noted symmetrically in bilateral lower extremities.  2+ DP pulse palpated.  Sensation intact to light touch of bilateral lower extremities.  No erythema or calf tenderness bilaterally.  No warmth of area.  Neurological: She is alert. She exhibits normal muscle tone. Coordination normal.  Skin: Skin is warm and dry. No rash noted.  Psychiatric: She has a normal mood and affect.  Nursing note and vitals reviewed.    ED Treatments / Results  Labs (all labs ordered are listed, but only abnormal results are displayed) Labs Reviewed  COMPREHENSIVE METABOLIC PANEL - Abnormal; Notable for the following components:      Result Value   Glucose, Bld 109 (*)    Calcium 8.7 (*)    All other  components within normal limits  CBC WITH DIFFERENTIAL/PLATELET  BRAIN NATRIURETIC PEPTIDE    EKG None  Radiology Dg Chest 2 View  Result Date: 09/29/2017 CLINICAL DATA:  Several weeks of lower extremities swelling. EXAM: CHEST - 2 VIEW COMPARISON:  Chest x-ray of January 23, 2015 FINDINGS: The lungs are well-expanded and clear. The heart and pulmonary vascularity are normal. The mediastinum is normal in width. There is no pleural effusion. The bony thorax is unremarkable. IMPRESSION: There is no active cardiopulmonary disease. Electronically Signed   By: David  [place] M.D.   On: 09/29/2017 14:00    Procedures Procedures (including critical care time)  Medications Ordered in ED Medications - No data to display  Initial Impression / Assessment and Plan / ED Course  I have reviewed the triage vital signs and the nursing notes.  Pertinent labs & imaging results that were available during my care of the patient were reviewed by me and considered in my medical decision making (see chart for details).     32 year old female with no severe past medical history presents to ED for intermittent bilateral lower extremity edema for the past several months.  She states that symptoms got worse this week.  However, with arrival to the ED she states that symptoms have improved since what they were yesterday.  States that she has had intermittent leg swelling from time to time since her pregnancy 12 years ago.  This is usually related to activity or salt intake.  She denies any shortness of breath, chest pain or other symptoms of fluid overload.  On my exam there is very minimal, nonpitting edema symmetrically to lower extremities.  She has no calf tenderness, edema or warmth of extremities that would concern me for vascular infectious cause of symptoms.  It appears that her symptoms are most likely related to salt intake.  CBC, BMP, BNP today are unremarkable.  Chest x-ray shows no signs of fluid  overload.  Will advise her to control salt intake and to wear compression stockings and elevation as needed.  She will need to establish care with a primary care provider if the symptoms are ongoing.  Advised to return to ED for any severe worsening symptoms.  Portions of this note were generated with Scientist, clinical (histocompatibility and immunogenetics). Dictation errors may occur despite best attempts at proofreading.   Final Clinical Impressions(s) / ED Diagnoses   Final diagnoses:  Peripheral edema    ED Discharge Orders    None       Dietrich Pates, PA-C 09/29/17 1423    Little, Ambrose [place], MD 09/30/17 1011

## 2019-06-09 IMAGING — CR DG CHEST 2V
2 series · 2 of 2 positions shown · non-contrast
Comparison: Chest x-ray of January 23, 2015

CLINICAL DATA: Several weeks of lower extremities swelling.

EXAM:
CHEST - 2 VIEW

[chest pa]
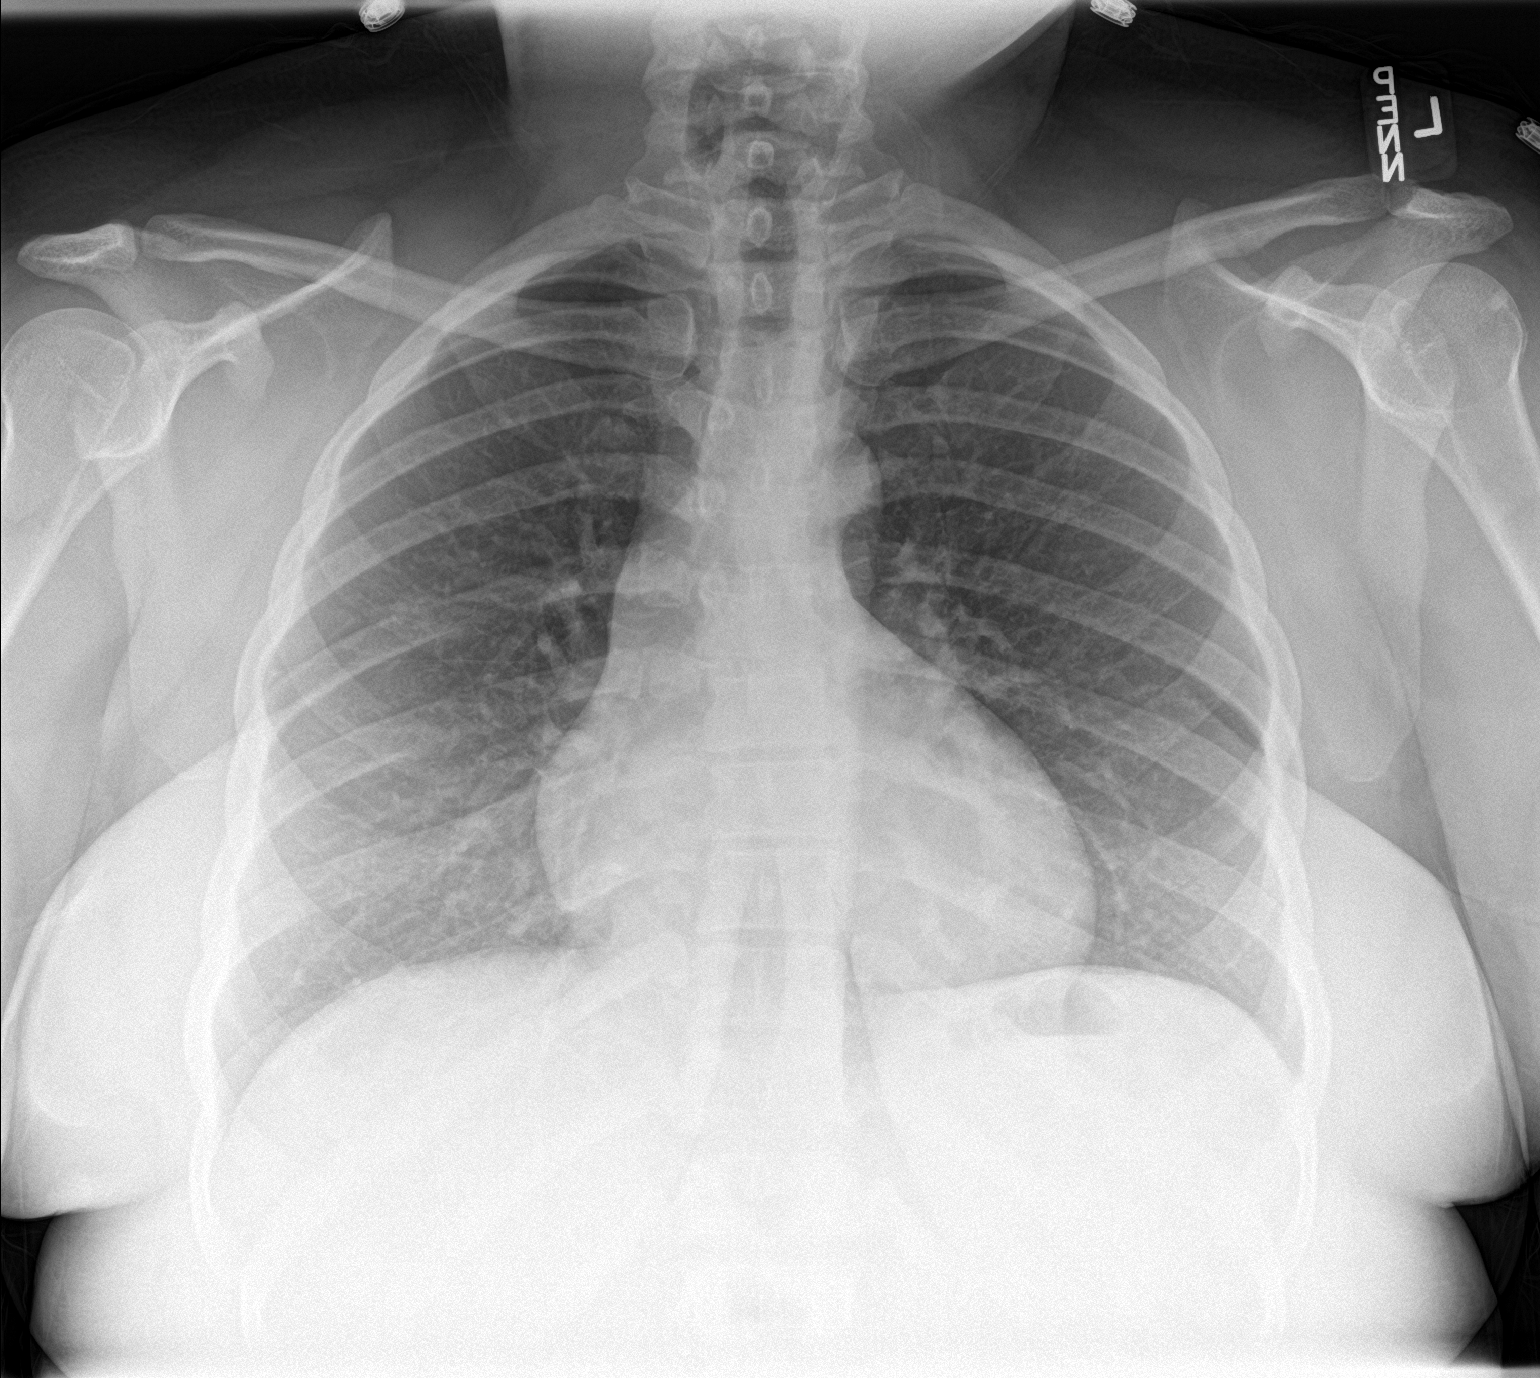

[chest lat]
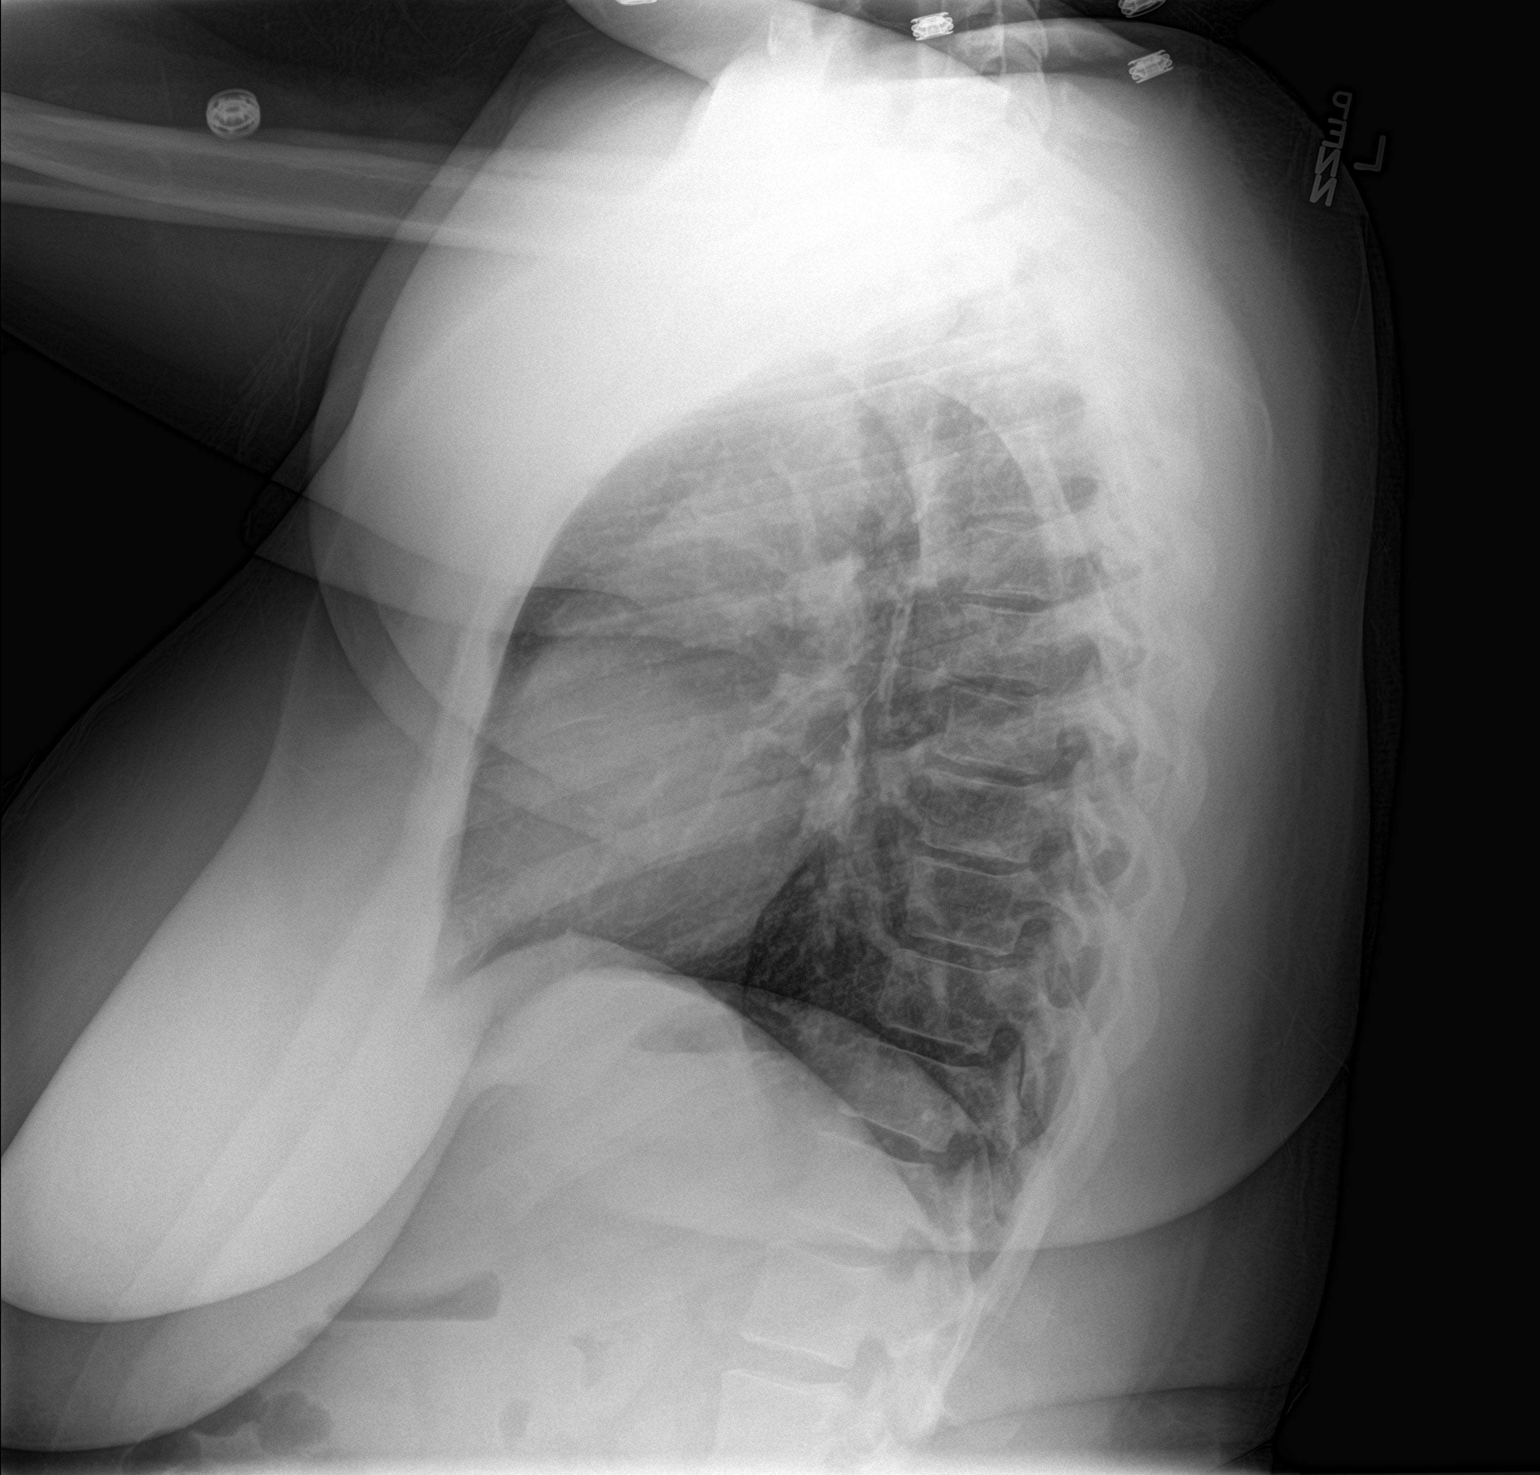

[2 of 2 positions shown; findings below may reference images not displayed]

FINDINGS: The lungs are well-expanded and clear. The heart and pulmonary
vascularity are normal. The mediastinum is normal in width. There is
no pleural effusion. The bony thorax is unremarkable.
IMPRESSION: There is no active cardiopulmonary disease.

## 2022-07-25 ENCOUNTER — Ambulatory Visit (HOSPITAL_COMMUNITY)
Admission: EM | Admit: 2022-07-25 | Discharge: 2022-07-25 | Disposition: A | Discharge status: Home / Self Care | Payer: Medicaid Other | Attending: Physician Assistant | Admitting: Physician Assistant

## 2022-07-25 ENCOUNTER — Encounter (HOSPITAL_COMMUNITY): Payer: Self-pay

## 2022-07-25 ENCOUNTER — Ambulatory Visit (INDEPENDENT_AMBULATORY_CARE_PROVIDER_SITE_OTHER): Payer: Medicaid Other

## 2022-07-25 DIAGNOSIS — M6520 Calcific tendinitis, unspecified site: Secondary | ICD-10-CM

## 2022-07-25 DIAGNOSIS — H60332 Swimmer's ear, left ear: Secondary | ICD-10-CM | POA: Diagnosis not present

## 2022-07-25 DIAGNOSIS — M79602 Pain in left arm: Secondary | ICD-10-CM | POA: Diagnosis not present

## 2022-07-25 MED ORDER — IBUPROFEN 800 MG PO TABS: 800.0000 mg | ORAL_TABLET | Freq: Three times a day (TID) | ORAL | 0 refills | Status: AC

## 2022-07-25 MED ORDER — OFLOXACIN 0.3 % OT SOLN: 5.0000 [drp] | Freq: Two times a day (BID) | OTIC | 0 refills | Status: AC

## 2022-07-25 MED ORDER — IBUPROFEN 800 MG PO TABS
800.0000 mg | ORAL_TABLET | Freq: Once | ORAL | Status: AC
  Administered 2022-07-25: 800 mg via ORAL

## 2022-07-25 MED ORDER — IBUPROFEN 800 MG PO TABS
ORAL_TABLET | ORAL | Status: AC
  Filled 2022-07-25: qty 1

## 2022-07-25 MED ORDER — METHOCARBAMOL 500 MG PO TABS: 500.0000 mg | ORAL_TABLET | Freq: Two times a day (BID) | ORAL | 0 refills | Status: AC

## 2022-07-25 NOTE — Discharge Instructions (Signed)
Your x-ray showed some calcium in your tendons which is associated with long-term tendinitis.  I believe this is contributing to your pain.  Continue ibuprofen 800 mg every 8 hours as needed.  We gave you a dose here so please do not take an additional dose until after 10 PM today.  You can use Tylenol for breakthrough pain.  Use Robaxin up to twice a day.  This make you sleepy so do not drive or drink alcohol with taking it.  Use a heating pad and gentle stretch for symptom relief.  I do recommend that you follow-up with sports medicine as soon as possible.  Call them to schedule an appointment.  If anything worsens please be seen immediately.  I am concerned that you have infection of your external ear.  Use ofloxacin drops as prescribed.  Do not put anything in your ears until symptoms improve.  Do not submerge your head in water until symptoms resolve.  If anything worsens and you have drainage from the ear, increasing pain, fever, nausea/vomiting you should be seen immediately.

## 2022-07-25 NOTE — ED Provider Notes (Signed)
[facility]    CSN: [id number] Arrival date & time: 07/25/22  1314      History   Chief Complaint Chief Complaint  Patient presents with   Shoulder Pain   Otalgia    HPI Virginia Page is a 37 y.o. female.   Patient presents today with a 2 to 3-day history of severe left shoulder pain.  She reports that earlier this week she took her kids to a water park and believes that she got swimmer's ear.  Because of the ear pain she was holding her shoulder in an awkward position and then slept on it to prevent the ear from touching the pillow.  Soon after that she developed significant pain and decreased range of motion.  She has tried swimmer's ear drops over-the-counter which have provided some improvement of the ear pain but her primary concern today is that shoulder pain.  She reports that this is rated 8 on a 0-10 pain scale at rest but increases to 10 with attempted movement, described as intense aching of periodic shooting pains into her arm with associated numbness in the fingers, no alleviating factors identified.  She has not tried any over-the-counter medications.  She is right-handed.  She is having difficulty with daily activities including sleeping as result of the pain.    Past Medical History:  Diagnosis Date   Complication of anesthesia    failed spinal anes last delivery   Migraine with aura     Patient Active Problem List   Diagnosis Date Noted   Constipation 05/30/2013   Pelvic pain in female 05/30/2013   Contraception management 08/30/2011   Migraine with aura 08/30/2011   Annual physical exam 08/30/2011   Obesity (BMI 30.0-34.9) 08/30/2011    Past Surgical History:  Procedure Laterality Date   CESAREAN SECTION     CESAREAN SECTION WITH BILATERAL TUBAL LIGATION N/A 09/04/2012   Procedure: REPEAT CESAREAN SECTION REPEAT/BILATERAL TUBAL LIGATION;  Surgeon: Kirkland Hun, MD;  Location: [facility];  Service: Obstetrics;  Laterality: N/A;  Repeat C/S    EYE SURGERY     HERNIA REPAIR      OB History     Gravida  3   Para  3   Term  1   Preterm      AB      Living  3      SAB      IAB      Ectopic      Multiple      Live Births  1            Home Medications    Prior to Admission medications   Medication Sig Start Date End Date Taking? Authorizing Provider  ibuprofen (ADVIL) 800 MG tablet Take 1 tablet (800 mg total) by mouth 3 (three) times daily. 07/25/22  Yes Brystal Kildow K, PA-C  methocarbamol (ROBAXIN) 500 MG tablet Take 1 tablet (500 mg total) by mouth 2 (two) times daily. 07/25/22  Yes Kollyn Lingafelter K, PA-C  ofloxacin (FLOXIN) 0.3 % OTIC solution Place 5 drops into the left ear 2 (two) times daily. 07/25/22  Yes Clema Skousen, Noberto Retort, PA-C    Family History Family History  Problem Relation Age of Onset   Heart disease Father    Diabetes Father    Hyperlipidemia Father    Hypertension Father    Kidney disease Father    Diabetes Maternal Aunt    Diabetes Maternal Uncle     Social History Social  History   Tobacco Use   Smoking status: Never   Smokeless tobacco: Never  Vaping Use   Vaping status: Never Used  Substance Use Topics   Alcohol use: No   Drug use: No     Allergies   Demerol [meperidine]   Review of Systems Review of Systems  Constitutional:  Positive for activity change. Negative for appetite change, fatigue and fever.  HENT:  Positive for ear pain. Negative for congestion, sinus pressure, sneezing and sore throat.   Gastrointestinal:  Negative for abdominal pain, diarrhea, nausea and vomiting.  Musculoskeletal:  Positive for arthralgias and neck pain. Negative for myalgias.  Neurological:  Positive for numbness (intermittent in fingers with shooting pain). Negative for weakness.     Physical Exam Triage Vital Signs ED Triage Vitals  Encounter Vitals Group     BP 07/25/22 1418 (!) 144/101     Systolic BP Percentile --      Diastolic BP Percentile --      Pulse Rate 07/25/22  1418 91     Resp 07/25/22 1418 16     Temp 07/25/22 1418 98.5 F (36.9 C)     Temp Source 07/25/22 1418 Oral     SpO2 07/25/22 1418 96 %     Weight --      Height --      Head Circumference --      Peak Flow --      Pain Score 07/25/22 1420 10     Pain Loc --      Pain Education --      Exclude from Growth Chart --    No data found.  Updated Vital Signs BP (!) 144/101 (BP Location: Right Arm)   Pulse 91   Temp 98.5 F (36.9 C) (Oral)   Resp 16   SpO2 96%   Visual Acuity Right Eye Distance:   Left Eye Distance:   Bilateral Distance:    Right Eye Near:   Left Eye Near:    Bilateral Near:     Physical Exam Vitals reviewed.  Constitutional:      General: She is awake. She is not in acute distress.    Appearance: Normal appearance. She is well-developed. She is not ill-appearing.     Comments: Very pleasant female appears stated age in no acute distress sitting comfortably in exam room  HENT:     Head: Normocephalic and atraumatic.     Right Ear: Tympanic membrane, ear canal and external ear normal. There is impacted cerumen.     Left Ear: Tympanic membrane, ear canal and external ear normal. Swelling and tenderness present. There is impacted cerumen.     Ears:     Comments: Left ear: Mild tenderness to palpation of tragus and manipulation of external ear.  Erythema noted external auditory canal.  Cerumen in canal.  Able to visualize approximately 30% of TM that appears normal. Cardiovascular:     Rate and Rhythm: Normal rate and regular rhythm.     Heart sounds: Normal heart sounds, S1 normal and S2 normal. No murmur heard.    Comments: Capillary refill within 2 seconds left fingers Pulmonary:     Effort: Pulmonary effort is normal.     Breath sounds: Normal breath sounds. No wheezing, rhonchi or rales.     Comments: Clear to auscultation bilaterally Musculoskeletal:     Left shoulder: Swelling and tenderness present. No bony tenderness. Decreased range of motion.  Decreased strength.     Cervical back:  Tenderness present. No bony tenderness.     Thoracic back: No tenderness or bony tenderness.     Lumbar back: No tenderness or bony tenderness.     Comments: Neck/back: Tenderness palpation over left trapezius with no spasm.  No pain percussion of vertebrae.  Normal active range of motion.  Left shoulder: Significant tenderness palpation over AC joint and humeral head.  No deformity noted.  Decreased range of motion secondary to pain.  Normal pincer grip strength.  Hand neurovascularly intact.  Patient unable to perform special test due to pain.  Psychiatric:        Behavior: Behavior is cooperative.      UC Treatments / Results  Labs (all labs ordered are listed, but only abnormal results are displayed) Labs Reviewed - No data to display  EKG   Radiology DG Shoulder Left  Result Date: 07/25/2022 CLINICAL DATA:  Pain with decreased range of motion EXAM: LEFT SHOULDER - 2+ VIEW COMPARISON:  None Available. FINDINGS: No fracture or malalignment. Small calcified loose body or calcific tendinopathy. AC joint is intact IMPRESSION: Small calcified loose body or calcific tendinopathy. Electronically Signed   By: Jasmine Pang M.D.   On: 07/25/2022 15:28    Procedures Procedures (including critical care time)  Medications Ordered in UC Medications  ibuprofen (ADVIL) tablet 800 mg (800 mg Oral Given 07/25/22 1445)    Initial Impression / Assessment and Plan / UC Course  I have reviewed the triage vital signs and the nursing notes.  Pertinent labs & imaging results that were available during my care of the patient were reviewed by me and considered in my medical decision making (see chart for details).     Otitis media identified on physical exam.  Patient was started on ofloxacin drops.  Recommend that she avoid submerging head in water or using anything in the ear until symptoms resolve.  She can use over-the-counter analgesics for pain relief.   Discussed that if she has any worsening symptoms she needs to be seen immediately.  X-ray was obtained given severity of pain and decreased range of motion that showed signs of calcific tendinopathy.  I suspect that the unusual movement of the shoulder to manage the ear pain has caused inflammation of this chronic tendinitis.  She was started on ibuprofen 800 mg and given a dose in clinic.  This did provide some relief of symptoms.  She was sent home with this medication with instruction to take additional NSAIDs with this medication to risk of GI bleeding.  She was also given Robaxin for pain relief.  We discussed this can be sedating and she is not drinking alcohol taking it.  Encouraged her to use heat, rest, stretch for additional symptom relief and follow-up closely with sports medicine.  She was given contact information for local provider with instruction to call to schedule an appointment.  If she has any worsening or changing symptoms she is to return for reevaluation.  Strict return precautions given.  Work excuse note provided.  Final Clinical Impressions(s) / UC Diagnoses   Final diagnoses:  Acute swimmer's ear of left side  Calcific tendonitis  Left arm pain     Discharge Instructions      Your x-ray showed some calcium in your tendons which is associated with long-term tendinitis.  I believe this is contributing to your pain.  Continue ibuprofen 800 mg every 8 hours as needed.  We gave you a dose here so please do not take an additional  dose until after 10 PM today.  You can use Tylenol for breakthrough pain.  Use Robaxin up to twice a day.  This make you sleepy so do not drive or drink alcohol with taking it.  Use a heating pad and gentle stretch for symptom relief.  I do recommend that you follow-up with sports medicine as soon as possible.  Call them to schedule an appointment.  If anything worsens please be seen immediately.  I am concerned that you have infection of your external  ear.  Use ofloxacin drops as prescribed.  Do not put anything in your ears until symptoms improve.  Do not submerge your head in water until symptoms resolve.  If anything worsens and you have drainage from the ear, increasing pain, fever, nausea/vomiting you should be seen immediately.     ED Prescriptions     Medication Sig Dispense Auth. Provider   ofloxacin (FLOXIN) 0.3 % OTIC solution Place 5 drops into the left ear 2 (two) times daily. 5 mL Teddi Badalamenti K, PA-C   ibuprofen (ADVIL) 800 MG tablet Take 1 tablet (800 mg total) by mouth 3 (three) times daily. 21 tablet Ellaina Schuler K, PA-C   methocarbamol (ROBAXIN) 500 MG tablet Take 1 tablet (500 mg total) by mouth 2 (two) times daily. 20 tablet Shawndell Varas, Noberto Retort, PA-C      PDMP not reviewed this encounter.   Jeani Hawking, PA-C 07/25/22 1538

## 2022-07-25 NOTE — ED Triage Notes (Signed)
Patient reports that she had left "swimmer's ear" 3 days ago and continues to have pain.  Patient states she has been sleeping with her left arm up to keep her left ear from touching the illow and is now having pain in the left shoulder and left arm.  Patient denies taking anything for her symptoms.

## 2023-09-23 ENCOUNTER — Other Ambulatory Visit: Payer: Self-pay | Admitting: Medical Genetics

## 2023-10-18 ENCOUNTER — Other Ambulatory Visit

## 2024-01-18 ENCOUNTER — Other Ambulatory Visit: Payer: Self-pay | Admitting: Medical Genetics

## 2024-01-18 DIAGNOSIS — Z006 Encounter for examination for normal comparison and control in clinical research program: Secondary | ICD-10-CM

## 2024-02-15 LAB — GENECONNECT MOLECULAR SCREEN: Genetic Analysis Overall Interpretation: NEGATIVE
